# Patient Record
Sex: Male | Born: 1970 | Race: Black or African American | Hispanic: No | Marital: Single | Smoking: Never smoker
Health system: Southern US, Community
[De-identification: ages and names within clinical notes are randomized; demographics above are authoritative.]

## PROBLEM LIST (undated history)

## (undated) DIAGNOSIS — Z9989 Dependence on other enabling machines and devices: Secondary | ICD-10-CM

## (undated) DIAGNOSIS — F419 Anxiety disorder, unspecified: Secondary | ICD-10-CM

## (undated) DIAGNOSIS — G4733 Obstructive sleep apnea (adult) (pediatric): Secondary | ICD-10-CM

## (undated) DIAGNOSIS — F32A Depression, unspecified: Secondary | ICD-10-CM

## (undated) DIAGNOSIS — K37 Unspecified appendicitis: Secondary | ICD-10-CM

## (undated) DIAGNOSIS — R42 Dizziness and giddiness: Secondary | ICD-10-CM

## (undated) HISTORY — DX: Unspecified appendicitis: K37

## (undated) HISTORY — DX: Depression, unspecified: F32.A

## (undated) HISTORY — DX: Anxiety disorder, unspecified: F41.9

## (undated) HISTORY — DX: Dependence on other enabling machines and devices: Z99.89

## (undated) HISTORY — DX: Obstructive sleep apnea (adult) (pediatric): G47.33

---

## 1998-09-26 ENCOUNTER — Emergency Department (HOSPITAL_COMMUNITY): Admission: EM | Admit: 1998-09-26 | Discharge: 1998-09-26 | Payer: Self-pay | Admitting: Emergency Medicine

## 1999-06-02 ENCOUNTER — Emergency Department (HOSPITAL_COMMUNITY): Admission: EM | Admit: 1999-06-02 | Discharge: 1999-06-02 | Payer: Self-pay | Admitting: Emergency Medicine

## 2001-07-18 ENCOUNTER — Emergency Department (HOSPITAL_COMMUNITY): Admission: EM | Admit: 2001-07-18 | Discharge: 2001-07-18 | Payer: Self-pay | Admitting: Emergency Medicine

## 2005-10-18 ENCOUNTER — Emergency Department (HOSPITAL_COMMUNITY): Admission: EM | Admit: 2005-10-18 | Discharge: 2005-10-18 | Payer: Self-pay | Admitting: Emergency Medicine

## 2009-04-27 ENCOUNTER — Emergency Department (HOSPITAL_COMMUNITY): Admission: EM | Admit: 2009-04-27 | Discharge: 2009-04-27 | Payer: Self-pay | Admitting: Emergency Medicine

## 2010-09-15 ENCOUNTER — Ambulatory Visit (HOSPITAL_COMMUNITY)
Admission: RE | Admit: 2010-09-15 | Discharge: 2010-09-15 | Payer: Self-pay | Source: Home / Self Care | Admitting: Family Medicine

## 2011-01-15 ENCOUNTER — Ambulatory Visit (HOSPITAL_COMMUNITY)
Admission: EM | Admit: 2011-01-15 | Discharge: 2011-01-16 | Disposition: A | Discharge status: Home / Self Care | Payer: 59 | Attending: General Surgery | Admitting: General Surgery

## 2011-01-15 ENCOUNTER — Other Ambulatory Visit: Payer: Self-pay | Admitting: General Surgery

## 2011-01-15 ENCOUNTER — Emergency Department (HOSPITAL_COMMUNITY): Payer: 59

## 2011-01-15 DIAGNOSIS — K3580 Unspecified acute appendicitis: Secondary | ICD-10-CM | POA: Insufficient documentation

## 2011-01-15 DIAGNOSIS — G4730 Sleep apnea, unspecified: Secondary | ICD-10-CM | POA: Insufficient documentation

## 2011-01-15 DIAGNOSIS — K660 Peritoneal adhesions (postprocedural) (postinfection): Secondary | ICD-10-CM | POA: Insufficient documentation

## 2011-01-15 DIAGNOSIS — D573 Sickle-cell trait: Secondary | ICD-10-CM | POA: Insufficient documentation

## 2011-01-15 HISTORY — PX: APPENDECTOMY: SHX54

## 2011-01-15 LAB — COMPREHENSIVE METABOLIC PANEL
ALT: 27 U/L (ref 0–53)
Albumin: 4 g/dL (ref 3.5–5.2)
BUN: 14 mg/dL (ref 6–23)
Calcium: 9 mg/dL (ref 8.4–10.5)
Chloride: 107 mEq/L (ref 96–112)
Creatinine, Ser: 1.19 mg/dL (ref 0.4–1.5)
GFR calc non Af Amer: >60 mL/min (ref >60)
Potassium: 4.1 mEq/L (ref 3.5–5.1)
Sodium: 138 mEq/L (ref 135–145)
Total Bilirubin: 0.8 mg/dL (ref 0.3–1.2)
Total Protein: 7.2 g/dL (ref 6.0–8.3)

## 2011-01-15 LAB — URINALYSIS, ROUTINE W REFLEX MICROSCOPIC
Bilirubin Urine: NEGATIVE
Ketones, ur: NEGATIVE mg/dL
Nitrite: NEGATIVE
Specific Gravity, Urine: 1.02 (ref 1.005–1.030)
pH: 5.5 (ref 5.0–8.0)

## 2011-01-15 LAB — DIFFERENTIAL
Basophils Absolute: 0 10*3/uL (ref 0.0–0.1)
Basophils Relative: 0 % (ref 0–1)
Eosinophils Absolute: 0.1 10*3/uL (ref 0.0–0.7)
Eosinophils Relative: 1 % (ref 0–5)
Lymphs Abs: 1.1 10*3/uL (ref 0.7–4.0)
Neutro Abs: 3.7 10*3/uL (ref 1.7–7.7)

## 2011-01-15 LAB — CBC
Hemoglobin: 13.3 g/dL (ref 13.0–17.0)
MCHC: 33.3 g/dL (ref 30.0–36.0)
RBC: 4.61 MIL/uL (ref 4.22–5.81)
WBC: 5.6 10*3/uL (ref 4.0–10.5)

## 2011-01-15 MED ORDER — IOHEXOL 300 MG/ML  SOLN
100.0000 mL | Freq: Once | INTRAMUSCULAR | Status: AC | PRN
  Administered 2011-01-15: 100 mL via INTRAVENOUS

## 2011-01-15 MED ORDER — IOHEXOL 300 MG/ML  SOLN: 100.0000 mL | Freq: Once | INTRAMUSCULAR | Status: AC | PRN

## 2011-01-22 LAB — URINALYSIS, ROUTINE W REFLEX MICROSCOPIC
Glucose, UA: NEGATIVE mg/dL
Ketones, ur: NEGATIVE mg/dL
Protein, ur: NEGATIVE mg/dL
Specific Gravity, Urine: 1.02 (ref 1.005–1.030)
Urobilinogen, UA: 0.2 mg/dL (ref 0.0–1.0)

## 2011-01-22 LAB — BASIC METABOLIC PANEL
Calcium: 9.5 mg/dL (ref 8.4–10.5)
Creatinine, Ser: 1.3 mg/dL (ref 0.4–1.5)
GFR calc Af Amer: >60 mL/min (ref >60)
GFR calc non Af Amer: >60 mL/min (ref >60)
Glucose, Bld: 84 mg/dL (ref 70–99)

## 2011-01-22 LAB — DIFFERENTIAL
Lymphs Abs: 1 10*3/uL (ref 0.7–4.0)
Monocytes Absolute: 0.8 10*3/uL (ref 0.1–1.0)
Monocytes Relative: 9 % (ref 3–12)
Neutro Abs: 6.7 10*3/uL (ref 1.7–7.7)

## 2011-01-22 LAB — CBC
Hemoglobin: 14.8 g/dL (ref 13.0–17.0)
MCHC: 33.3 g/dL (ref 30.0–36.0)
MCV: 90.8 fL (ref 78.0–100.0)
WBC: 8.7 10*3/uL (ref 4.0–10.5)

## 2011-01-22 LAB — LIPASE, BLOOD: Lipase: 24 U/L (ref 11–59)

## 2011-01-23 NOTE — Op Note (Signed)
Isaac Vasquez, Isaac Vasquez              ACCOUNT NO.:  [id number]  MEDICAL RECORD NO.:  [id number]           PATIENT TYPE:  O  LOCATION:  1525                         FACILITY:  [facility]  PHYSICIAN:  Angelia Mould. Derrell Lolling, M.D.DATE OF BIRTH:  1971/08/25  DATE OF PROCEDURE:  01/15/2011 DATE OF DISCHARGE:                              OPERATIVE REPORT   PREOPERATIVE DIAGNOSIS:  Acute appendicitis.  POSTOPERATIVE DIAGNOSIS:  Acute appendicitis.  OPERATION PERFORMED:  Laparoscopic appendectomy.  SURGEON:  Angelia Mould. Derrell Lolling, M.D.  OPERATIVE INDICATIONS:  This is a 40 year old African American male with sickle cell trait and sleep apnea.  He presents with a 24-hour history of right lower quadrant pain.  On exam, he has localized tenderness and involuntary guarding in the right lower quadrant.  CBC is normal, but CT scan shows a dilated appendix with some subtle soft tissue stranding around it.  I felt that clinically he had appendicitis.  He was brought to the operating room emergently.  OPERATIVE FINDINGS:  The appendix was swollen and inflamed, but there was no exudate, there was no gangrene, and there was no evidence of rupture.  There were some adhesions tethering the right colon to the anterior abdominal wall which were taken down carefully under direct vision, and there was no evidence of any injury to the colon after this was done.  The terminal ileum looked normal.  There was no fluid in the pelvis.  The liver looked healthy.  OPERATIVE TECHNIQUE:  Following the induction of general endotracheal anesthesia, Foley catheter was inserted, and the patient's abdomen was prepped and draped in a sterile fashion.  Intravenous antibiotics had been given prior to the procedure.  Surgical time-out was held identifying correct patient and correct procedure.  0.5% Marcaine with epinephrine was used as a local infiltration anesthetic.  An 11-mm Hasson trocar was inserted in the midline just above  the umbilicus with an open technique.  Entry was atraumatic.  This was secured with a purse- string suture of 0 Vicryl.  Pneumoperitoneum was created.  Video cam was inserted.  A 12-mm trocar was placed in the suprapubic area and a 5-mm trocar was placed in the left lower quadrant.  Using the Harmonic scalpel, I carefully took down some very filmy adhesions that were tethering the right colon to the anterior abdominal wall.  The reason for these was unclear, but they were chronic and not acute.  The colon was inspected following taking down these adhesions and there was no evidence of any injury.  I identified the appendix and identified the tip of the appendix and lifted it up with a grasper.  I divided some of the lateral peritoneal attachments to mobilize it medially.  The appendiceal mesentery was divided in small steps using the Harmonic scalpel.  We had good hemostasis.  I did this in several steps until I had completely skeletonized the appendix and could clearly see the junction of the appendix with the wall of the cecum.  An Endo GIA stapler with a vascular 2.5-mm load was inserted and placed transversely and perpendicularly across the base of the appendix.  This was closed down  on the base of the appendix and held in place for about 45 seconds, fired, and removed.  The staple line looked very good.  The appendix was placed in the specimen bag and removed.  I irrigated the operative field.  I inspected all the areas of dissection and everything looked fine.  There was no bleeding from the staple line.  The trocars were removed under direct vision.  There was no bleeding from the trocar sites.  Pneumoperitoneum was released.  The fascia at the umbilicus was closed with 0 Vicryl sutures.  Skin incisions were irrigated and then closed with subcuticular sutures of 4-0 Monocryl and Dermabond.  Clean bandages were placed and the patient was taken to the recovery room in stable  condition.  Estimated blood loss was about 10-20 cc. Complications none.  Sponge, needle, and instrument counts were correct.     Angelia Mould. Derrell Lolling, M.D.     HMI/MEDQ  D:  01/15/2011  T:  01/16/2011  Job:  [id number]  cc:   Renaye Rakers, M.D. Fax: [phone]  Electronically Signed by Claud Kelp M.D. on 01/23/2011 06:48:07 AM

## 2011-01-23 NOTE — H&P (Signed)
NAMEDERRECK, WILTSEY              ACCOUNT NO.:  [id number]  MEDICAL RECORD NO.:  [id number]           PATIENT TYPE:  O  LOCATION:  1525                         FACILITY:  [facility]  PHYSICIAN:  Angelia Mould. Derrell Lolling, M.D.DATE OF BIRTH:  12-22-1970  DATE OF ADMISSION:  01/15/2011 DATE OF DISCHARGE:                             HISTORY & PHYSICAL   CHIEF COMPLAINT:  Right lower quadrant abdominal pain.  HISTORY OF PRESENT ILLNESS:  This is a reasonably healthy 40 year old African American male who noted the onset of right-sided abdominal pain yesterday at 2 o'clock p.m.  It started gradually, but has gotten worse. He vomited once this evening, but only after drinking CT contrast.  He has been having normal bowel movements.  He felt cold at work yesterday and they took his temperature, he said it was 100.0.  No prior similar episodes.  He came to the emergency room.  Lab work has been drawn and is normal. A CT scan shows abnormally enlarged appendix with subtle periappendiceal stranding, consistent with uncomplicated appendicitis.  I was called to evaluate him at that point.  PAST HISTORY:  He has sleep apnea.  He has sickle cell trait.  He has never had any surgery.  He denies any GI tract disease, pulmonary disease, cardiovascular disease, or urologic problems.  CURRENT MEDICATIONS:  Multivitamins.  DRUG ALLERGIES:  None known.  SOCIAL HISTORY:  He is single, has 2 sons, age 60 and 50.  He is here with his girlfriend.  He denies tobacco.  Says he drinks alcohol only once every week or two.  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  FAMILY HISTORY:  Mother living and treated for breast cancer.  Father living and well.  One brother and one sister living and well.  REVIEW OF SYSTEMS:  Ten-system review of systems is performed and is negative except as described above.  PHYSICAL EXAMINATION:  GENERAL:  Pleasant, youngish middle-aged man in mild distress. VITAL  SIGNS:  Temperature 99.0, heart rate 80, blood pressure 126/76, respiratory rate 20 and unlabored. HEENT:  Eyes:  Sclerae clear.  Extraocular movements intact.  Ears, nose, mouth, throat, nose, lips, tongue, and oropharynx are without gross lesions. NECK:  Supple, nontender.  No mass.  No jugular venous distention. Trachea midline. LUNGS:  Clear to auscultation.  No chest wall tenderness. HEART:  Regular rate and rhythm.  No murmur.  Radial and femoral pulses are palpable. ABDOMEN:  Soft.  He is tender with involuntary guarding in the right lower quadrant and to a lesser degree in the suprapubic area.  Upper abdomen and left lower quadrant are nontender.  There is no mass.  There are no scars.  There is no hernia. EXTREMITIES:  He moves all 4 extremities well without pain or deformity. NEUROLOGIC:  No gross motor or sensory deficits.  ADMISSION DATA:  CT scan described above suggesting acute appendicitis. CBC normal.  Urinalysis normal.  ASSESSMENT:  Acute appendicitis, no evidence of perforation or peritonitis.  PLAN: 1. The patient has been started on intravenous Invanz. 2. He will be taken to the operating room for diagnostic laparoscopy  and appendectomy.  I have discussed the indication and details of surgery with him.  Risks and complications have been outlined, including but not limited to bleeding, infection, conversion to open laparotomy, more extensive surgery if alternative diagnosis is found, injury to adjacent organs with major reconstructive surgery, wound healing problems such as infection or hernia, cardiac, pulmonary, and thromboembolic problems. He seems to understand these issues well.  At this time, all of his questions were answered.  He is in full agreement with this plan.     Angelia Mould. Derrell Lolling, M.D.     HMI/MEDQ  D:  01/15/2011  T:  01/16/2011  Job:  [id number]  cc:   Renaye Rakers, M.D. Fax: [phone]  Electronically Signed by Claud Kelp M.D.  on 01/23/2011 06:47:31 AM

## 2011-01-30 ENCOUNTER — Other Ambulatory Visit: Payer: Self-pay | Admitting: Surgery

## 2011-01-30 ENCOUNTER — Other Ambulatory Visit (HOSPITAL_COMMUNITY): Payer: Self-pay | Admitting: Surgery

## 2011-01-30 ENCOUNTER — Other Ambulatory Visit: Payer: Self-pay | Admitting: General Surgery

## 2011-01-30 ENCOUNTER — Ambulatory Visit
Admission: RE | Admit: 2011-01-30 | Discharge: 2011-01-30 | Disposition: A | Discharge status: Home / Self Care | Payer: 59 | Source: Ambulatory Visit | Attending: Surgery | Admitting: Surgery

## 2011-01-30 DIAGNOSIS — K37 Unspecified appendicitis: Secondary | ICD-10-CM

## 2011-01-30 DIAGNOSIS — R198 Other specified symptoms and signs involving the digestive system and abdomen: Secondary | ICD-10-CM

## 2011-01-30 DIAGNOSIS — L0291 Cutaneous abscess, unspecified: Secondary | ICD-10-CM

## 2011-01-30 MED ORDER — IOHEXOL 300 MG/ML  SOLN
125.0000 mL | Freq: Once | INTRAMUSCULAR | Status: AC | PRN
  Administered 2011-01-30: 125 mL via INTRAVENOUS

## 2011-01-31 ENCOUNTER — Other Ambulatory Visit (HOSPITAL_COMMUNITY): Payer: 59

## 2011-03-20 ENCOUNTER — Encounter (INDEPENDENT_AMBULATORY_CARE_PROVIDER_SITE_OTHER): Payer: Self-pay | Admitting: General Surgery

## 2013-01-16 ENCOUNTER — Encounter: Payer: Self-pay | Admitting: Physician Assistant

## 2013-04-07 ENCOUNTER — Ambulatory Visit (INDEPENDENT_AMBULATORY_CARE_PROVIDER_SITE_OTHER): Payer: BLUE CROSS/BLUE SHIELD | Admitting: General Surgery

## 2013-04-07 ENCOUNTER — Other Ambulatory Visit (INDEPENDENT_AMBULATORY_CARE_PROVIDER_SITE_OTHER): Payer: Self-pay | Admitting: General Surgery

## 2013-04-07 ENCOUNTER — Encounter (INDEPENDENT_AMBULATORY_CARE_PROVIDER_SITE_OTHER): Payer: Self-pay | Admitting: General Surgery

## 2013-04-07 VITALS — BP 132/90 | HR 78 | Temp 98.6°F | Resp 16 | Ht 74.0 in | Wt 231.8 lb

## 2013-04-07 DIAGNOSIS — L910 Hypertrophic scar: Secondary | ICD-10-CM

## 2013-04-07 NOTE — Patient Instructions (Signed)
You have a keloid scar, or hypertrophic scar of the umbilical skin. It is not unusual to have pain in this area.  You will be referred to a plastic surgeon for scar revision options and procedures.

## 2013-04-07 NOTE — Progress Notes (Signed)
Patient ID: Isaac Vasquez, male   DOB: [birth year], 42 y.o.   MRN: [id number] History: This patient returns to see me to evaluate a painful hypertrophic scar. I performed laparoscopic appendectomy on 01/15/2011 for histologically confirmed acute separative appendicitis. He recovered uneventfully. He remains healthy. He states he has developed a progressive hypertrophy of the umbilical scar which is somewhat painful when he twists or poles in certain directions.  Exam: Patient looks well. Wife is with him. Abdomen soft and nontender. There is a hypertrophic keloid scar at the super umbilical position, almost 1.5 cm in diameter. The other 2 trocar sites on the left side have healed without any unusual scar formation.  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 Derrell Lolling, M.D., [facility] General and Minimally invasive Surgery Breast and Colorectal Surgery Office:   [phone] Pager:   [phone]

## 2013-04-08 ENCOUNTER — Telehealth (INDEPENDENT_AMBULATORY_CARE_PROVIDER_SITE_OTHER): Payer: Self-pay | Admitting: General Surgery

## 2013-04-08 NOTE — Telephone Encounter (Signed)
Left message on machine for patient to call back and ask for me. To make patient aware I have made a plastic surgery referral to Dr Etter Sjogren. Appt 04/27/2013 at 2:30pm. Dr Shon Hough was out of network with his insurance. Records faxed to Dr Odis Luster at [phone]. Confirmation received. Awaiting call back from patient to make him aware of appt.

## 2013-04-08 NOTE — Telephone Encounter (Signed)
Patient called back- spoke with [facility] and was made aware of appts.

## 2013-04-27 ENCOUNTER — Ambulatory Visit
Admission: RE | Admit: 2013-04-27 | Discharge: 2013-04-27 | Disposition: A | Discharge status: Home / Self Care | Payer: BC Managed Care – PPO | Source: Ambulatory Visit | Attending: Family Medicine | Admitting: Family Medicine

## 2013-04-27 ENCOUNTER — Other Ambulatory Visit: Payer: Self-pay | Admitting: Family Medicine

## 2013-04-27 DIAGNOSIS — M1250 Traumatic arthropathy, unspecified site: Secondary | ICD-10-CM

## 2013-07-29 ENCOUNTER — Ambulatory Visit: Payer: BC Managed Care – PPO

## 2013-07-29 ENCOUNTER — Ambulatory Visit (INDEPENDENT_AMBULATORY_CARE_PROVIDER_SITE_OTHER): Payer: BC Managed Care – PPO | Admitting: Family Medicine

## 2013-07-29 VITALS — BP 132/78 | HR 75 | Temp 98.2°F | Resp 18 | Ht 74.0 in | Wt 233.6 lb

## 2013-07-29 DIAGNOSIS — S61219A Laceration without foreign body of unspecified finger without damage to nail, initial encounter: Secondary | ICD-10-CM

## 2013-07-29 DIAGNOSIS — M79645 Pain in left finger(s): Secondary | ICD-10-CM

## 2013-07-29 DIAGNOSIS — M79609 Pain in unspecified limb: Secondary | ICD-10-CM

## 2013-07-29 DIAGNOSIS — S61209A Unspecified open wound of unspecified finger without damage to nail, initial encounter: Secondary | ICD-10-CM

## 2013-07-29 NOTE — Patient Instructions (Signed)

## 2013-07-29 NOTE — Progress Notes (Signed)
Reviewed documentation and xray and agree w/ assessment and plan. Katessa Attridge, MD MPH   

## 2013-07-29 NOTE — Progress Notes (Signed)
  Subjective:    Patient ID: Isaac Vasquez, male    DOB: 12-Jul-1971, 42 y.o.   MRN: [id number]  HPI   Isaac Vasquez is a very pleasant 42 yr old male here after sustaining a laceration to his left index finger.  Laceration occurred about 2 hours ago while he was preparing dinner.  Sliced finger while he was chopping food.  He denies numbness, tingling, weakness but does have pain  He is right hand dominant.  Last tetanus 1 yr ago    Review of Systems  Constitutional: Negative.   HENT: Negative.   Respiratory: Negative.   Cardiovascular: Negative.   Gastrointestinal: Positive for abdominal distention.  Musculoskeletal: Negative.   Skin: Positive for wound.  Neurological: Negative.        Objective:   Physical Exam  Vitals reviewed. Constitutional: He is oriented to person, place, and time. He appears well-developed and well-nourished. No distress.  HENT:  Head: Normocephalic and atraumatic.  Eyes: Conjunctivae are normal. No scleral icterus.  Pulmonary/Chest: Effort normal.  Musculoskeletal:       Hands: Laceration at distal end of left index finger; very tender to palpation; full ROM but with pain  Neurological: He is alert and oriented to person, place, and time.  Skin: Skin is warm and dry. Laceration noted.  Psychiatric: He has a normal mood and affect. His behavior is normal.    [facility] reading (PRIMARY) by  Dr. Clelia Croft - no fracture   Procedure Note: Verbal consent obtained from the patient.  Metacarpal block with 2 cc Lidocaine 2% without epinephrine.  Wound scrubbed with soap and water.  Wound explored.  No foreign bodies or deep structure injury noted.  Wound closed with #3 simple interrupted sutures of 5-0 ethilon.  Area cleansed and dressed.       Assessment & Plan:  Finger laceration, initial encounter - Plan: DG Finger Index Left  Pain in finger of left hand   Isaac Vasquez is a pleasant 42 yr old male here with laceration to left index finger.  Due to significant pain  with palpation, xray was performed and ruled out fracture.  Laceration repaired as above.  Anticipate suture removal in 7 days.  Discussed wound care and RTC precautions.

## 2013-08-15 ENCOUNTER — Ambulatory Visit (INDEPENDENT_AMBULATORY_CARE_PROVIDER_SITE_OTHER): Payer: BC Managed Care – PPO | Admitting: Physician Assistant

## 2013-08-15 VITALS — BP 128/72 | HR 78 | Temp 98.1°F | Resp 18

## 2013-08-15 DIAGNOSIS — S61402D Unspecified open wound of left hand, subsequent encounter: Secondary | ICD-10-CM

## 2013-08-15 DIAGNOSIS — Z5189 Encounter for other specified aftercare: Secondary | ICD-10-CM

## 2013-08-15 NOTE — Progress Notes (Signed)
  Subjective:    Patient ID: Isaac Vasquez, male    DOB: December 05, 1970, 42 y.o.   MRN: [id number]  HPI 42 year old male presents for suture removal. DOI 07/29/13.  Wound is of left index finger. Patient denies erythema, warmth, or drainage. Doing well with no issues or complaints    Review of Systems  Constitutional: Negative for fever and chills.  Gastrointestinal: Negative for nausea and vomiting.  Skin: Positive for wound. Negative for color change.       Objective:   Physical Exam  Constitutional: He is oriented to person, place, and time. He appears well-developed and well-nourished.  HENT:  Head: Normocephalic and atraumatic.  Right Ear: External ear normal.  Left Ear: External ear normal.  Eyes: Conjunctivae are normal.  Neck: Normal range of motion.  Cardiovascular: Normal rate.   Pulmonary/Chest: Effort normal.  Neurological: He is alert and oriented to person, place, and time.  Psychiatric: He has a normal mood and affect. His behavior is normal. Judgment and thought content normal.      Sutures removed without difficulty.     Assessment & Plan:   Wound, open, hand with or without fingers with complication, left, subsequent encounter  Wound well healing.  Follow up as needed.

## 2014-01-02 IMAGING — CR DG FINGER INDEX 2+V*L*
1 series · 1 of 1 positions shown · non-contrast
Comparison: None.

CLINICAL DATA: Pain post trauma

EXAM:
LEFT INDEX FINGER 2+V

[PA]
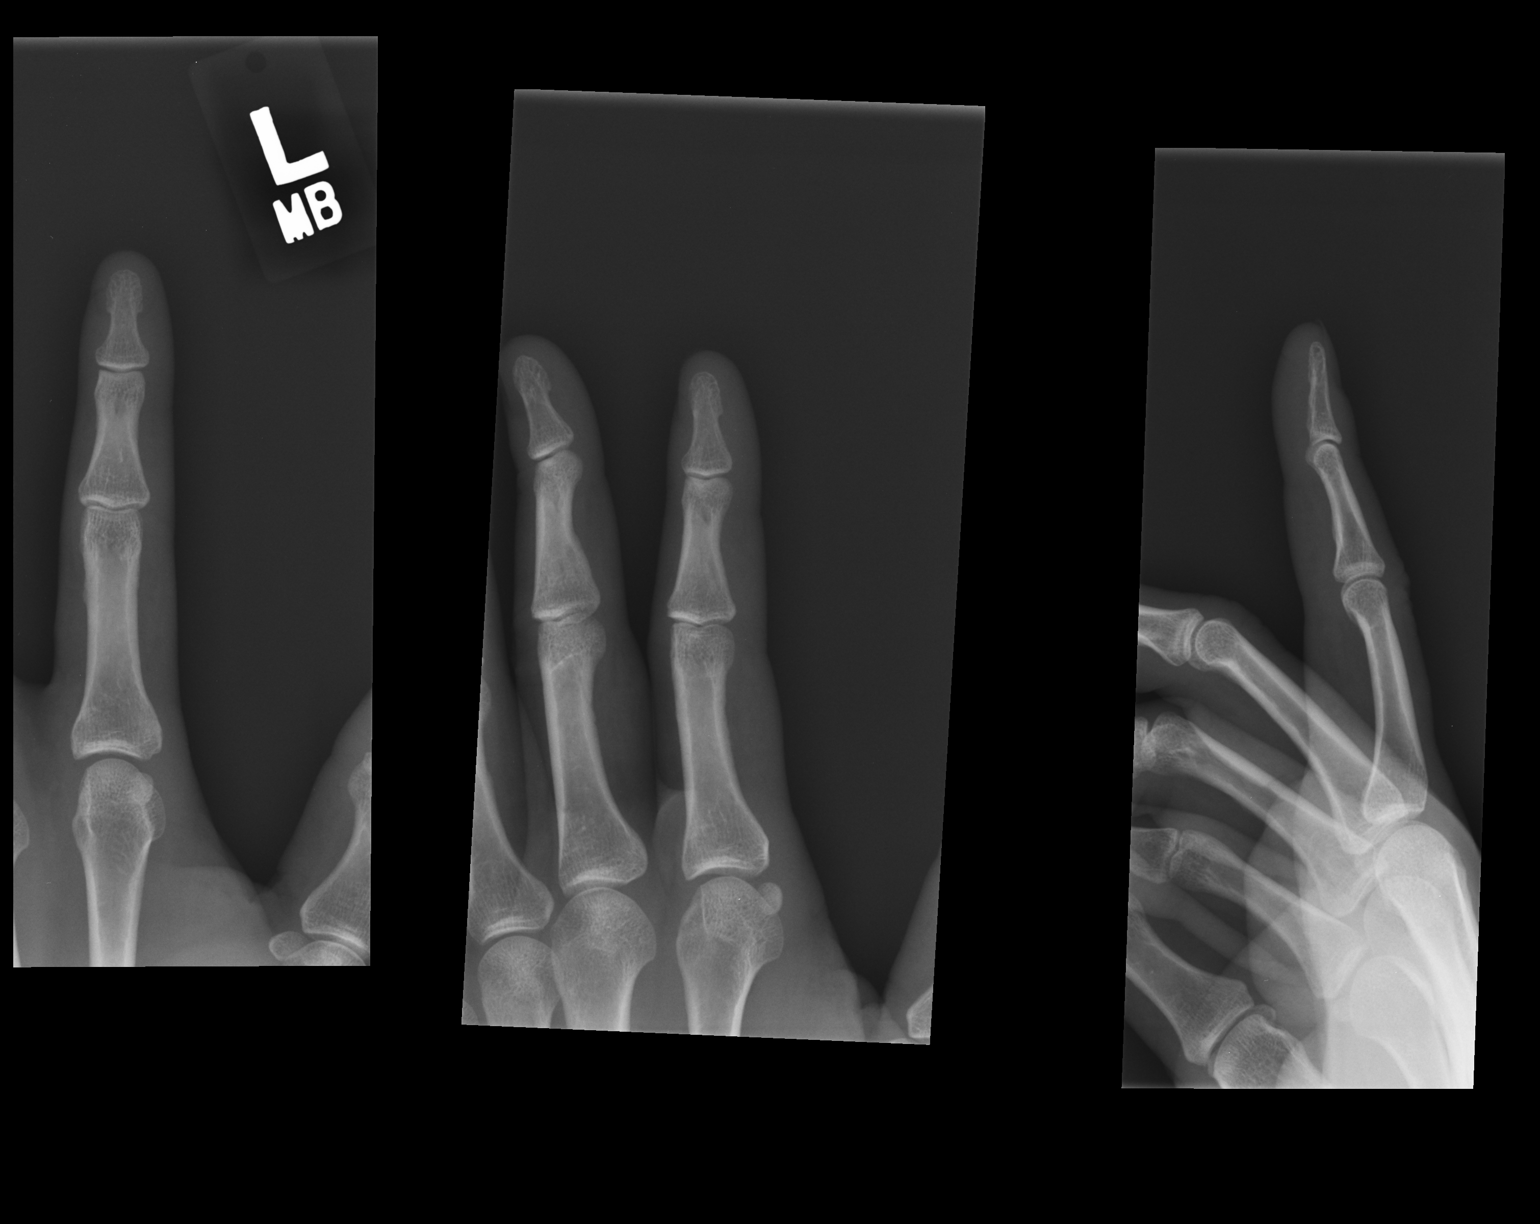

[1 of 1 positions shown; findings below may reference images not displayed]

FINDINGS: Frontal, oblique, and lateral views were obtained. There is no
fracture or dislocation. Joint spaces appear intact. No erosive
change. No radiopaque foreign body.
IMPRESSION: No abnormality noted.

## 2015-03-09 ENCOUNTER — Encounter (HOSPITAL_COMMUNITY): Payer: Self-pay | Admitting: Emergency Medicine

## 2015-03-09 ENCOUNTER — Emergency Department (HOSPITAL_COMMUNITY)
Admission: EM | Admit: 2015-03-09 | Discharge: 2015-03-09 | Disposition: A | Discharge status: Home / Self Care | Payer: 59 | Attending: Emergency Medicine | Admitting: Emergency Medicine

## 2015-03-09 DIAGNOSIS — Z79899 Other long term (current) drug therapy: Secondary | ICD-10-CM | POA: Insufficient documentation

## 2015-03-09 DIAGNOSIS — R55 Syncope and collapse: Secondary | ICD-10-CM | POA: Diagnosis present

## 2015-03-09 DIAGNOSIS — Z8719 Personal history of other diseases of the digestive system: Secondary | ICD-10-CM | POA: Diagnosis not present

## 2015-03-09 DIAGNOSIS — R42 Dizziness and giddiness: Secondary | ICD-10-CM | POA: Insufficient documentation

## 2015-03-09 HISTORY — DX: Dizziness and giddiness: R42

## 2015-03-09 LAB — BASIC METABOLIC PANEL
ANION GAP: 10 (ref 5–15)
BUN: 20 mg/dL (ref 6–20)
CO2: 26 mmol/L (ref 22–32)
CREATININE: 0.99 mg/dL (ref 0.61–1.24)
Calcium: 9.5 mg/dL (ref 8.9–10.3)
Chloride: 102 mmol/L (ref 101–111)
GFR calc non Af Amer: >60 mL/min (ref >60)
GLUCOSE: 108 mg/dL — AB (ref 65–99)
POTASSIUM: 3.9 mmol/L (ref 3.5–5.1)
SODIUM: 138 mmol/L (ref 135–145)

## 2015-03-09 LAB — CBC
HCT: 42 % (ref 39.0–52.0)
HEMOGLOBIN: 14.1 g/dL (ref 13.0–17.0)
MCH: 29.6 pg (ref 26.0–34.0)
MCHC: 33.6 g/dL (ref 30.0–36.0)
MCV: 88.2 fL (ref 78.0–100.0)
Platelets: 184 10*3/uL (ref 150–400)
RBC: 4.76 MIL/uL (ref 4.22–5.81)
RDW: 12.8 % (ref 11.5–15.5)
WBC: 5.7 10*3/uL (ref 4.0–10.5)

## 2015-03-09 MED ORDER — MECLIZINE HCL 25 MG PO TABS
25.0000 mg | ORAL_TABLET | Freq: Once | ORAL | Status: AC
  Administered 2015-03-09: 25 mg via ORAL
  Filled 2015-03-09: qty 1

## 2015-03-09 MED ORDER — MECLIZINE HCL 50 MG PO TABS: 50.0000 mg | ORAL_TABLET | Freq: Two times a day (BID) | ORAL | Status: DC

## 2015-03-09 MED ORDER — ACETAMINOPHEN 500 MG PO TABS
1000.0000 mg | ORAL_TABLET | Freq: Once | ORAL | Status: AC
  Administered 2015-03-09: 1000 mg via ORAL
  Filled 2015-03-09: qty 2

## 2015-03-09 NOTE — ED Notes (Signed)
Pt states he is dizzy and has pain to the back of his head and neck  Pt states EMS came out to his job but he refused transport  Pt states ems told him he had positive orthostatic vital signs  Pt states he continues to have dizziness and does not feel right

## 2015-03-09 NOTE — ED Provider Notes (Signed)
CSN: [id number]     Arrival date & time 03/09/15  1940 History   First MD Initiated Contact with Patient 03/09/15 2051     Chief Complaint  Patient presents with  . Near Syncope     (Consider location/radiation/quality/duration/timing/severity/associated sxs/prior Treatment) HPI Comments: Patient presents emergency department with chief complaint of dizziness. Patient states that the dizziness began around 3:00 today. He describes the room as spinning. He also complains of a headache.  The dizziness is aggravated with head movement.  Nothing makes it better.  He has a hx of the same.  Denies any vision changes, numbness, weakness, or tingling of the extremities.    The history is provided by the patient. No language interpreter was used.    Past Medical History  Diagnosis Date  . Appendicitis   . Vertigo    Past Surgical History  Procedure Laterality Date  . Appendectomy  01/15/11   Family History  Problem Relation Age of Onset  . Cancer Mother   . Cancer Father    History  Substance Use Topics  . Smoking status: Never Smoker   . Smokeless tobacco: Not on file  . Alcohol Use: Yes     Comment: OCCASSIONAL    Review of Systems  Constitutional: Negative for fever and chills.  Respiratory: Negative for shortness of breath.   Cardiovascular: Negative for chest pain.  Gastrointestinal: Negative for nausea, vomiting, diarrhea and constipation.  Genitourinary: Negative for dysuria.  Neurological: Positive for dizziness.  All other systems reviewed and are negative.     Allergies  Review of patient's allergies indicates no known allergies.  Home Medications   Prior to Admission medications   Medication Sig Start Date End Date Taking? Authorizing Provider  cetirizine (ZYRTEC) 10 MG tablet Take 10 mg by mouth daily.   Yes Historical Provider, MD  Multiple Vitamin (MULTIVITAMIN WITH MINERALS) TABS tablet Take 1 tablet by mouth daily.   Yes Historical Provider, MD   BP  149/74 mmHg  Pulse 67  Temp(Src) 97.8 F (36.6 C) (Oral)  Resp 10  SpO2 99% Physical Exam  Constitutional: He is oriented to person, place, and time. He appears well-developed and well-nourished.  HENT:  Head: Normocephalic and atraumatic.  Horizontal nystagmus   Eyes: Conjunctivae and EOM are normal. Pupils are equal, round, and reactive to light. Right eye exhibits no discharge. Left eye exhibits no discharge. No scleral icterus.  Neck: Normal range of motion. Neck supple. No JVD present.  Cardiovascular: Normal rate, regular rhythm and normal heart sounds.  Exam reveals no gallop and no friction rub.   No murmur heard. Pulmonary/Chest: Effort normal and breath sounds normal. No respiratory distress. He has no wheezes. He has no rales. He exhibits no tenderness.  Abdominal: Soft. He exhibits no distension and no mass. There is no tenderness. There is no rebound and no guarding.  Musculoskeletal: Normal range of motion. He exhibits no edema or tenderness.  Neurological: He is alert and oriented to person, place, and time.  CN 3-12 intact, normal finger to nose, normal heel to shin, speech is clear, movements are goal oriented  Skin: Skin is warm and dry.  Psychiatric: He has a normal mood and affect. His behavior is normal. Judgment and thought content normal.  Nursing note and vitals reviewed.   ED Course  Procedures (including critical care time) Labs Review Labs Reviewed  BASIC METABOLIC PANEL - Abnormal; Notable for the following:    Glucose, Bld 108 (*)    All  other components within normal limits  CBC    Imaging Review No results found.   EKG Interpretation None      MDM   Final diagnoses:  Vertigo    Patient with dizziness that started earlier today. He has a history of vertigo. Describes his symptoms as the room is spinning. Symptoms are made worse with head movement. Neurovascularly intact. I suspect this is vertigo.  9:57 PM Patient seen by and discussed  with Dr. Eulis Foster, who recommends treating for peripheral vertigo. Patient given meclizine. Will attempt to ambulate. Will reassess.  Patient feels improved, ambulates without difficulty will discharge with meclizine.  Montine Circle, PA-C 03/10/15 0102  Daleen Bo, MD 03/11/15 [phone]

## 2015-03-09 NOTE — ED Provider Notes (Signed)
  Face-to-face evaluation   History: He reports onset of dizziness described as spinning, today at about 3:00. He was evaluated by EMS and refused transport. He had 2 episodes of vomiting  Physical exam: Alert, calm, cooperative. He is eating a cracker. He has bilateral direction changing nystagmus, which is extinguishable. Positive symptoms with head impulse testing. Negative test of skew.  Medical screening examination/treatment/procedure(s) were conducted as a shared visit with non-physician practitioner(s) and myself.  I personally evaluated the patient during the encounter  Daleen Bo, MD 03/11/15 [phone]

## 2015-03-09 NOTE — Discharge Instructions (Signed)
Benign Positional Vertigo Vertigo means you feel like you or your surroundings are moving when they are not. Benign positional vertigo is the most common form of vertigo. Benign means that the cause of your condition is not serious. Benign positional vertigo is more common in older adults. CAUSES  Benign positional vertigo is the result of an upset in the labyrinth system. This is an area in the middle ear that helps control your balance. This may be caused by a viral infection, head injury, or repetitive motion. However, often no specific cause is found. SYMPTOMS  Symptoms of benign positional vertigo occur when you move your head or eyes in different directions. Some of the symptoms may include:  Loss of balance and falls.  Vomiting.  Blurred vision.  Dizziness.  Nausea.  Involuntary eye movements (nystagmus). DIAGNOSIS  Benign positional vertigo is usually diagnosed by physical exam. If the specific cause of your benign positional vertigo is unknown, your caregiver may perform imaging tests, such as magnetic resonance imaging (MRI) or computed tomography (CT). TREATMENT  Your caregiver may recommend movements or procedures to correct the benign positional vertigo. Medicines such as meclizine, benzodiazepines, and medicines for nausea may be used to treat your symptoms. In rare cases, if your symptoms are caused by certain conditions that affect the inner ear, you may need surgery. HOME CARE INSTRUCTIONS   Follow your caregiver's instructions.  Move slowly. Do not make sudden body or head movements.  Avoid driving.  Avoid operating heavy machinery.  Avoid performing any tasks that would be dangerous to you or others during a vertigo episode.  Drink enough fluids to keep your urine clear or pale yellow. SEEK IMMEDIATE MEDICAL CARE IF:   You develop problems with walking, weakness, numbness, or using your arms, hands, or legs.  You have difficulty speaking.  You develop  severe headaches.  Your nausea or vomiting continues or gets worse.  You develop visual changes.  Your family or friends notice any behavioral changes.  Your condition gets worse.  You have a fever.  You develop a stiff neck or sensitivity to light. MAKE SURE YOU:   Understand these instructions.  Will watch your condition.  Will get help right away if you are not doing well or get worse. Document Released: 07/09/2006 Document Revised: 12/24/2011 Document Reviewed: 06/21/2011 [facility] Patient Information 2015 [place], [place]. This information is not intended to replace advice given to you by your health care provider. Make sure you discuss any questions you have with your health care provider.  Dizziness Dizziness is a common problem. It is a feeling of unsteadiness or light-headedness. You may feel like you are about to faint. Dizziness can lead to injury if you stumble or fall. A person of any age group can suffer from dizziness, but dizziness is more common in older adults. CAUSES  Dizziness can be caused by many different things, including:  Middle ear problems.  Standing for too long.  Infections.  An allergic reaction.  Aging.  An emotional response to something, such as the sight of blood.  Side effects of medicines.  Tiredness.  Problems with circulation or blood pressure.  Excessive use of alcohol or medicines, or illegal drug use.  Breathing too fast (hyperventilation).  An irregular heart rhythm (arrhythmia).  A low red blood cell count (anemia).  Pregnancy.  Vomiting, diarrhea, fever, or other illnesses that cause body fluid loss (dehydration).  Diseases or conditions such as Parkinson's disease, high blood pressure (hypertension), diabetes, and thyroid problems.  Exposure to extreme heat. DIAGNOSIS  Your health care provider will ask about your symptoms, perform a physical exam, and perform an electrocardiogram (ECG) to record the electrical  activity of your heart. Your health care provider may also perform other heart or blood tests to determine the cause of your dizziness. These may include:  Transthoracic echocardiogram (TTE). During echocardiography, sound waves are used to evaluate how blood flows through your heart.  Transesophageal echocardiogram (TEE).  Cardiac monitoring. This allows your health care provider to monitor your heart rate and rhythm in real time.  Holter monitor. This is a portable device that records your heartbeat and can help diagnose heart arrhythmias. It allows your health care provider to track your heart activity for several days if needed.  Stress tests by exercise or by giving medicine that makes the heart beat faster. TREATMENT  Treatment of dizziness depends on the cause of your symptoms and can vary greatly. HOME CARE INSTRUCTIONS   Drink enough fluids to keep your urine clear or pale yellow. This is especially important in very hot weather. In older adults, it is also important in cold weather.  Take your medicine exactly as directed if your dizziness is caused by medicines. When taking blood pressure medicines, it is especially important to get up slowly.  Rise slowly from chairs and steady yourself until you feel okay.  In the morning, first sit up on the side of the bed. When you feel okay, stand slowly while holding onto something until you know your balance is fine.  Move your legs often if you need to stand in one place for a long time. Tighten and relax your muscles in your legs while standing.  Have someone stay with you for 1-2 days if dizziness continues to be a problem. Do this until you feel you are well enough to stay alone. Have the person call your health care provider if he or she notices changes in you that are concerning.  Do not drive or use heavy machinery if you feel dizzy.  Do not drink alcohol. SEEK IMMEDIATE MEDICAL CARE IF:   Your dizziness or light-headedness  gets worse.  You feel nauseous or vomit.  You have problems talking, walking, or using your arms, hands, or legs.  You feel weak.  You are not thinking clearly or you have trouble forming sentences. It may take a friend or family member to notice this.  You have chest pain, abdominal pain, shortness of breath, or sweating.  Your vision changes.  You notice any bleeding.  You have side effects from medicine that seems to be getting worse rather than better. MAKE SURE YOU:   Understand these instructions.  Will watch your condition.  Will get help right away if you are not doing well or get worse. Document Released: 03/27/2001 Document Revised: 10/06/2013 Document Reviewed: 04/20/2011 [facility] Patient Information 2015 [place], [place]. This information is not intended to replace advice given to you by your health care provider. Make sure you discuss any questions you have with your health care provider.

## 2016-04-05 ENCOUNTER — Ambulatory Visit: Payer: 59 | Admitting: Family Medicine

## 2016-04-26 ENCOUNTER — Encounter: Payer: Self-pay | Admitting: Family Medicine

## 2016-04-26 ENCOUNTER — Ambulatory Visit (INDEPENDENT_AMBULATORY_CARE_PROVIDER_SITE_OTHER): Payer: 59 | Admitting: Family Medicine

## 2016-04-26 ENCOUNTER — Ambulatory Visit (INDEPENDENT_AMBULATORY_CARE_PROVIDER_SITE_OTHER)
Admission: RE | Admit: 2016-04-26 | Discharge: 2016-04-26 | Disposition: A | Discharge status: Home / Self Care | Payer: 59 | Source: Ambulatory Visit | Attending: Family Medicine | Admitting: Family Medicine

## 2016-04-26 VITALS — BP 140/90 | HR 69 | Temp 98.5°F | Resp 12 | Ht 74.0 in | Wt 237.4 lb

## 2016-04-26 DIAGNOSIS — Z6830 Body mass index (BMI) 30.0-30.9, adult: Secondary | ICD-10-CM | POA: Diagnosis not present

## 2016-04-26 DIAGNOSIS — R030 Elevated blood-pressure reading, without diagnosis of hypertension: Secondary | ICD-10-CM

## 2016-04-26 DIAGNOSIS — M545 Low back pain: Secondary | ICD-10-CM

## 2016-04-26 DIAGNOSIS — G8929 Other chronic pain: Secondary | ICD-10-CM | POA: Diagnosis not present

## 2016-04-26 LAB — BASIC METABOLIC PANEL
BUN: 19 mg/dL (ref 6–23)
CHLORIDE: 105 meq/L (ref 96–112)
CO2: 28 meq/L (ref 19–32)
Calcium: 9.3 mg/dL (ref 8.4–10.5)
Creatinine, Ser: 1.25 mg/dL (ref 0.40–1.50)
GFR: 80.04 mL/min (ref >60.00)
GLUCOSE: 93 mg/dL (ref 70–99)
Potassium: 4.2 mEq/L (ref 3.5–5.1)
Sodium: 139 mEq/L (ref 135–145)

## 2016-04-26 LAB — POCT URINALYSIS DIPSTICK
Bilirubin, UA: NEGATIVE
Blood, UA: NEGATIVE
GLUCOSE UA: NEGATIVE
KETONES UA: NEGATIVE
Leukocytes, UA: NEGATIVE
Nitrite, UA: NEGATIVE
Protein, UA: NEGATIVE
Urobilinogen, UA: 0.2
pH, UA: 6

## 2016-04-26 NOTE — Progress Notes (Signed)
HPI:   [name] is a 45 y.o. male, who is here today to establish care with me.  Former PCP: Dr Isaac Vasquez. Last preventive routine visit: a months ago.  Concerns today: right low back pain. Hx of back "spasms" He has had back spasms since 2010 after appendicectomy, stopped for 4-5 years and started back about 6 months ago.  Intermittent, no radiated, no hx of trauma. Mentions that his shoe size has increased, attributed to pronounce arch. This happens daily depending of level of activity, last "a few moments" 8/10.  He has not tried medication for this.   His BP elevated today, no prior Hx of HTN.  Slept about 4 hours last night, stayed up late.   He does not exercise regularly due to work schedule but he eats healthy otherwise. Hx of migraines, he knows his trigger factors and if he avoids those he has not headache.  No gross hematuria or Hx of smoking.  Lab Results  Component Value Date   CREATININE 0.99 03/09/2015   BUN 20 03/09/2015   NA 138 03/09/2015   K 3.9 03/09/2015   CL 102 03/09/2015   CO2 26 03/09/2015   + Stress.   Review of Systems  Constitutional: Negative for fever, activity change, appetite change, fatigue and unexpected weight change.  HENT: Negative for nosebleeds, sore throat and trouble swallowing.   Eyes: Negative for redness and visual disturbance.  Respiratory: Negative for apnea, cough, shortness of breath and wheezing.   Cardiovascular: Negative for chest pain, palpitations and leg swelling.  Gastrointestinal: Negative for nausea, vomiting and abdominal pain.  Endocrine: Negative for polydipsia, polyphagia and polyuria.  Genitourinary: Negative for dysuria, hematuria and decreased urine volume.  Musculoskeletal: Positive for back pain. Negative for gait problem.  Skin: Negative for color change and rash.  Neurological: Negative for dizziness, seizures, weakness, numbness and headaches.  Psychiatric/Behavioral: Negative  for confusion and sleep disturbance.      Current Outpatient Prescriptions on File Prior to Visit  Medication Sig Dispense Refill  . cetirizine (ZYRTEC) 10 MG tablet Take 10 mg by mouth daily.    . Multiple Vitamin (MULTIVITAMIN WITH MINERALS) TABS tablet Take 1 tablet by mouth daily.     No current facility-administered medications on file prior to visit.     Past Medical History  Diagnosis Date  . Appendicitis   . Vertigo    No Known Allergies  Family History  Problem Relation Age of Onset  . Breast cancer Mother   . Prostate cancer Father     Social History   Social History  . Marital Status: Single    Spouse Name: N/A  . Number of Children: N/A  . Years of Education: N/A   Social History Main Topics  . Smoking status: Never Smoker   . Smokeless tobacco: None  . Alcohol Use: Yes     Comment: OCCASSIONAL  . Drug Use: No  . Sexual Activity: Not Asked   Other Topics Concern  . None   Social History Narrative    Filed Vitals:   04/26/16 0804  BP: 140/90  Pulse: 69  Temp: 98.5 F (36.9 C)  Resp: 12    Body mass index is 30.46 kg/(m^2).  SpO2 Readings from Last 3 Encounters:  04/26/16 97%  03/09/15 99%  08/15/13 100%       Physical Exam  Nursing note and vitals reviewed. Constitutional: He is oriented to person, place, and time. He appears well-developed.  No distress.  HENT:  Head: Atraumatic.  Mouth/Throat: Oropharynx is clear and moist and mucous membranes are normal.  Eyes: Conjunctivae and EOM are normal. Pupils are equal, round, and reactive to light.  Cardiovascular: Normal rate and regular rhythm.   No murmur heard. Pulses:      Dorsalis pedis pulses are 2+ on the right side, and 2+ on the left side.  Respiratory: Effort normal and breath sounds normal. No respiratory distress.  GI: Soft. He exhibits no mass. There is no hepatomegaly. There is no tenderness.  Musculoskeletal: He exhibits no edema.  + tenderness upon palpation of  paraspinal muscles, L3-4 right side. Pain is not elicited with movement on exam table during examination.     Lymphadenopathy:    He has no cervical adenopathy.  Neurological: He is alert and oriented to person, place, and time. He has normal strength. No cranial nerve deficit. Coordination and gait normal.  Reflex Scores:      Patellar reflexes are 2+ on the right side and 2+ on the left side. SLR negative bilateral.  Skin: Skin is warm. No erythema.  Psychiatric: His mood appears anxious (mild).  Well groomed, good eye contact.      ASSESSMENT AND PLAN:   Lab Results  Component Value Date   CREATININE 1.25 04/26/2016   BUN 19 04/26/2016   NA 139 04/26/2016   K 4.2 04/26/2016   CL 105 04/26/2016   CO2 28 04/26/2016     [name] was seen today for new patient (initial visit).  Diagnoses and all orders for this visit:  Chronic right-sided low back pain without sciatica  Seems to be musculoskeletal. He is not interested in medication for now. Because chronic and reporting no prior imaging done, lumbar plain x-ray was arranged. I think he would benefit from either PT or chiropractor evaluation, he decides to do the latter one. He doesn't need a referral so he will schedule appointment. Instructed about warning signs. Follow-up as needed.  -     DG Lumbar Spine Complete; Future -     POCT urinalysis dipstick: Negative   BMI 30.0-30.9,adult  We discussed benefits of wt loss as well as adverse effects of obesity. Consistency with healthy diet and physical activity recommended. Weight Watchers is a good option as well as daily brisk walking for 15-30 min as tolerated.  -     Basic metabolic panel   Elevated blood pressure (not hypertension)  Recheck and better reading, initial BP 150/98. Recommend checking blood pressure clinically. Healthy diet and regular physical activity for primary prevention. Goal < 140/90    We will try to obtain medical records from  former PCP, including labs he had a month ago. Planning on seeing him back in a year for his routine physical, before if needed.       Betty G. Martinique, MD  [facility] [place] office.

## 2016-04-26 NOTE — Patient Instructions (Addendum)
A few things to remember from today's visit:   Chronic right-sided low back pain without sciatica  BMI 30.0-30.9,adult Urine negative.    New recommendation for screening colonoscopy are to start at 53 in [place], you could find out from your insuracne if they will cover one.  We will try to obtain copy of labs done a month ago and recommend more labs if needed.  Today kidney function and urine done.   Healthy diet and regular exercise, brisk daily walking 15-30 min is a good option. Chiropractor recommended.   Please be sure medication list is accurate. If a new problem present, please set up appointment sooner than planned today.

## 2016-04-26 NOTE — Progress Notes (Signed)
Pre visit review using our clinic review tool, if applicable. No additional management support is needed unless otherwise documented below in the visit note. 

## 2016-05-22 ENCOUNTER — Other Ambulatory Visit: Payer: Self-pay | Admitting: General Surgery

## 2017-04-21 NOTE — Progress Notes (Signed)
HPI:  [name] is a 46 y.o.male here today for his routine physical examination. Last visit on 04/26/2016. Since his last OV he had surgery, mass removed from trunk in 05/22/2016, stayed in hospital for about 2 days.  He lives with girlfriend.  Regular exercise 3 or more times per week: Yes, he is swims, resistance exercise, and wt lifting. Following a healthy diet: Not consistently.   Chronic medical problems: Lower back pain (muscle spasms), elevated BP readings, seasonal allergies,and obesity among some.OSA with CPAP.  Hx of STD's: Many years ago trich. No sexually active for over year.His girlfriend has multiple medical problem.   -Denies high alcohol intake, tobacco use, or Hx of illicit drug use.  -Concerns today:   He has not noted morning erections and has noted that erections are not "a they use to be." He has erections but not as strong and lasting less than usual. This has been going on for 4 months. He denies decreased libido,depression symptoms, or fatigue.  Denies dysuria,increased urinary frequency, gross hematuria,or decreased urine output.   Review of Systems  Constitutional: Negative for activity change, appetite change, fatigue, fever and unexpected weight change.  HENT: Negative for dental problem, hearing loss, mouth sores, nosebleeds, sore throat, trouble swallowing and voice change.   Eyes: Negative for redness and visual disturbance.  Respiratory: Negative for cough, shortness of breath and wheezing.   Cardiovascular: Negative for chest pain, palpitations and leg swelling.  Gastrointestinal: Negative for abdominal pain, blood in stool, nausea and vomiting.  Endocrine: Negative for cold intolerance, heat intolerance, polydipsia, polyphagia and polyuria.  Genitourinary: Negative for decreased urine volume, dysuria, genital sores, hematuria and testicular pain.  Musculoskeletal: Positive for back pain (occasional). Negative for joint  swelling and myalgias.  Skin: Negative for rash.  Allergic/Immunologic: Positive for environmental allergies.  Neurological: Negative for dizziness, syncope, weakness, numbness and headaches.  Hematological: Negative for adenopathy. Does not bruise/bleed easily.  Psychiatric/Behavioral: Negative for confusion and sleep disturbance. The patient is not nervous/anxious.   All other systems reviewed and are negative.    Current Outpatient Prescriptions on File Prior to Visit  Medication Sig Dispense Refill  . cetirizine (ZYRTEC) 10 MG tablet Take 10 mg by mouth daily.    . Multiple Vitamin (MULTIVITAMIN WITH MINERALS) TABS tablet Take 1 tablet by mouth daily.     No current facility-administered medications on file prior to visit.      Past Medical History:  Diagnosis Date  . Appendicitis   . OSA on CPAP   . Vertigo    Past Surgical History:  Procedure Laterality Date  . APPENDECTOMY  01/15/11    No Known Allergies  Family History  Problem Relation Age of Onset  . Breast cancer Mother   . Cancer Mother        breast  . Prostate cancer Father   . Cancer Father 27       prostate  . Diabetes Neg Hx     Social History   Social History  . Marital status: Single    Spouse name: N/A  . Number of children: N/A  . Years of education: N/A   Social History Main Topics  . Smoking status: Never Smoker  . Smokeless tobacco: Never Used  . Alcohol use Yes     Comment: OCCASSIONAL  . Drug use: No  . Sexual activity: Not Currently   Other Topics Concern  . None   Social History Narrative  .  None     Vitals:   04/22/17 0759  BP: 138/80  Pulse: 76  Resp: 12   Body mass index is 31.47 kg/m.  O2 sat at RA 97%  Wt Readings from Last 3 Encounters:  04/22/17 245 lb 2 oz (111.2 kg)  04/26/16 237 lb 6 oz (107.7 kg)  07/29/13 233 lb 9.6 oz (106 kg)     Physical Exam  Nursing note and vitals reviewed. Constitutional: He is oriented to person, place, and time. He  appears well-developed. No distress.  HENT:  Head: Atraumatic.  Right Ear: Tympanic membrane, external ear and ear canal normal.  Left Ear: Tympanic membrane, external ear and ear canal normal.  Mouth/Throat: Oropharynx is clear and moist and mucous membranes are normal.  Eyes: Conjunctivae and EOM are normal. Pupils are equal, round, and reactive to light.  Neck: Normal range of motion. No tracheal deviation present. No thyroid mass and no thyromegaly present.  Cardiovascular: Normal rate and regular rhythm.   No murmur heard. Pulses:      Dorsalis pedis pulses are 2+ on the right side, and 2+ on the left side.  Respiratory: Effort normal and breath sounds normal. No respiratory distress.  GI: Soft. He exhibits no mass. There is no tenderness. Hernia confirmed negative in the right inguinal area and confirmed negative in the left inguinal area.  Genitourinary: Testes normal and penis normal. Circumcised.  Musculoskeletal: He exhibits no edema or tenderness.  No major deformities appreciated and no signs of synovitis.  Lymphadenopathy:    He has no cervical adenopathy.       Right: No inguinal and no supraclavicular adenopathy present.       Left: No inguinal and no supraclavicular adenopathy present.  Neurological: He is alert and oriented to person, place, and time. He has normal strength. No cranial nerve deficit or sensory deficit. Coordination and gait normal.  Reflex Scores:      Bicep reflexes are 2+ on the right side and 2+ on the left side.      Patellar reflexes are 2+ on the right side and 2+ on the left side. Skin: Skin is warm. No rash noted. No erythema.  Psychiatric: He has a normal mood and affect. Cognition and memory are normal.  Well groomed, good eye contact.     ASSESSMENT AND PLAN:   [name] was seen today for annual exam.  Diagnoses and all orders for this visit:  Routine general medical examination at a health care facility  We discussed the  importance of regular physical activity and healthy diet for prevention of chronic illness and/or complications. Preventive guidelines reviewed. STD prevention discussed. Vaccination up to date.  Next CPE in 1 year.   -     Basic metabolic panel -     Lipid panel -     Hemoglobin A1c  Lipid screening -     Lipid panel  Diabetes mellitus screening -     Basic metabolic panel -     Hemoglobin A1c  Erectile dysfunction, unspecified erectile dysfunction type  Possible etiologies discussed. For now I do not think pharmacologic treatment is necessary. Further recommendations will be given according to lab results.  -     TSH -     PSA(Must document that pt has been informed of limitations of PSA testing.)  Prostate cancer screening  Current recommendations reviewed, he agrees with screening. Because + FHx, father Dx with prostate cancer in his mid 80's.  -  PSA(Must document that pt has been informed of limitations of PSA testing.)  Encounter for screening for HIV -     HIV antibody    Return in 1 year (on 04/22/2018) for CPE.    Malaina Mortellaro G. Martinique, MD  [facility]. [place] office.

## 2017-04-22 ENCOUNTER — Encounter: Payer: Self-pay | Admitting: Family Medicine

## 2017-04-22 ENCOUNTER — Ambulatory Visit (INDEPENDENT_AMBULATORY_CARE_PROVIDER_SITE_OTHER): Payer: 59 | Admitting: Family Medicine

## 2017-04-22 VITALS — BP 138/80 | HR 76 | Resp 12 | Ht 74.0 in | Wt 245.1 lb

## 2017-04-22 DIAGNOSIS — N529 Male erectile dysfunction, unspecified: Secondary | ICD-10-CM | POA: Diagnosis not present

## 2017-04-22 DIAGNOSIS — Z125 Encounter for screening for malignant neoplasm of prostate: Secondary | ICD-10-CM | POA: Diagnosis not present

## 2017-04-22 DIAGNOSIS — Z114 Encounter for screening for human immunodeficiency virus [HIV]: Secondary | ICD-10-CM

## 2017-04-22 DIAGNOSIS — Z0000 Encounter for general adult medical examination without abnormal findings: Secondary | ICD-10-CM | POA: Diagnosis not present

## 2017-04-22 DIAGNOSIS — Z13220 Encounter for screening for lipoid disorders: Secondary | ICD-10-CM

## 2017-04-22 DIAGNOSIS — Z131 Encounter for screening for diabetes mellitus: Secondary | ICD-10-CM | POA: Diagnosis not present

## 2017-04-22 LAB — BASIC METABOLIC PANEL
BUN: 12 mg/dL (ref 6–23)
CHLORIDE: 107 meq/L (ref 96–112)
CO2: 23 meq/L (ref 19–32)
Calcium: 8.9 mg/dL (ref 8.4–10.5)
Creatinine, Ser: 1.24 mg/dL (ref 0.40–1.50)
GFR: 80.44 mL/min (ref >60.00)
Glucose, Bld: 99 mg/dL (ref 70–99)
Potassium: 3.8 mEq/L (ref 3.5–5.1)
SODIUM: 138 meq/L (ref 135–145)

## 2017-04-22 LAB — PSA: PSA: 0.66 ng/mL (ref 0.10–4.00)

## 2017-04-22 LAB — LIPID PANEL
CHOL/HDL RATIO: 5
Cholesterol: 199 mg/dL (ref 0–200)
HDL: 37.8 mg/dL — ABNORMAL LOW (ref >39.00)
LDL CALC: 137 mg/dL — AB (ref 0–99)
NonHDL: 161
Triglycerides: 121 mg/dL (ref 0.0–149.0)
VLDL: 24.2 mg/dL (ref 0.0–40.0)

## 2017-04-22 LAB — TSH: TSH: 1.54 u[IU]/mL (ref 0.35–4.50)

## 2017-04-22 LAB — HEMOGLOBIN A1C: Hgb A1c MFr Bld: 5.5 % (ref 4.6–6.5)

## 2017-04-22 NOTE — Patient Instructions (Signed)
A few things to remember from today's visit:   Routine general medical examination at a health care facility - Plan: Basic metabolic panel, Lipid panel, Hemoglobin A1c  Lipid screening - Plan: Lipid panel  Diabetes mellitus screening - Plan: Basic metabolic panel, Hemoglobin A1c  Erectile dysfunction, unspecified erectile dysfunction type - Plan: TSH, PSA(Must document that pt has been informed of limitations of PSA testing.)  Prostate cancer screening - Plan: PSA(Must document that pt has been informed of limitations of PSA testing.)  Encounter for screening for HIV - Plan: HIV antibody    At least 150 minutes of moderate exercise per week, daily brisk walking for 15-30 min is a good exercise option. Healthy diet low in saturated (animal) fats and sweets and consisting of fresh fruits and vegetables, lean meats such as fish and white chicken and whole grains.  - Vaccines:  Tdap vaccine every 10 years.  Shingles vaccine recommended at age 27, could be given after 47 years of age but not sure about insurance coverage.  Pneumonia vaccines:  Prevnar 40 at 7 and Pneumovax at 93.   -Screening recommendations for low/normal risk males:  Screening for diabetes at age 64-45 and every 3 years.    Lipid screening at 35 and every 3 years.   Colonoscopy for colon cancer screening at age 70 and until age 47.  Prostate cancer screening: some controversy.        Please be sure medication list is accurate. If a new problem present, please set up appointment sooner than planned today.

## 2017-04-23 LAB — HIV ANTIBODY (ROUTINE TESTING W REFLEX): HIV 1&2 Ab, 4th Generation: NONREACTIVE

## 2017-04-26 ENCOUNTER — Encounter: Payer: Self-pay | Admitting: Family Medicine

## 2017-07-04 ENCOUNTER — Encounter: Payer: Self-pay | Admitting: Family Medicine

## 2018-04-22 DIAGNOSIS — E785 Hyperlipidemia, unspecified: Secondary | ICD-10-CM | POA: Insufficient documentation

## 2018-04-22 NOTE — Progress Notes (Deleted)
HPI:  [name] is a 47 y.o.male here today for his routine physical examination.  Last CPE: 04/22/17 He lives with his girlfriend.  Regular exercise 3 or more times per week: *** Following a healthy diet: ***   Chronic medical problems: Back pain,allergies,OSA on CPAP,and obesity.  Hx of STD's: Dalbert Batman many years ago.   There is no immunization history on file for this patient.    -Hep C screening (if born 40-1966): ***   Last colon cancer screening: *** Last prostate ca screening: ***  -Denies high alcohol intake, tobacco use, or Hx of illicit drug use.  -Concerns and/or follow up today: ***    Review of Systems   Current Outpatient Medications on File Prior to Visit  Medication Sig Dispense Refill  . cetirizine (ZYRTEC) 10 MG tablet Take 10 mg by mouth daily.    . Multiple Vitamin (MULTIVITAMIN WITH MINERALS) TABS tablet Take 1 tablet by mouth daily.     No current facility-administered medications on file prior to visit.      Past Medical History:  Diagnosis Date  . Appendicitis   . OSA on CPAP   . Vertigo     Past Surgical History:  Procedure Laterality Date  . APPENDECTOMY  01/15/11    No Known Allergies  Family History  Problem Relation Age of Onset  . Breast cancer Mother   . Cancer Mother        breast  . Prostate cancer Father   . Cancer Father 48       prostate  . Diabetes Neg Hx     Social History   Socioeconomic History  . Marital status: Single    Spouse name: Not on file  . Number of children: Not on file  . Years of education: Not on file  . Highest education level: Not on file  Occupational History  . Not on file  Social Needs  . Financial resource strain: Not on file  . Food insecurity:    Worry: Not on file    Inability: Not on file  . Transportation needs:    Medical: Not on file    Non-medical: Not on file  Tobacco Use  . Smoking status: Never Smoker  . Smokeless tobacco: Never Used    Substance and Sexual Activity  . Alcohol use: Yes    Comment: OCCASSIONAL  . Drug use: No  . Sexual activity: Not Currently  Lifestyle  . Physical activity:    Days per week: Not on file    Minutes per session: Not on file  . Stress: Not on file  Relationships  . Social connections:    Talks on phone: Not on file    Gets together: Not on file    Attends religious service: Not on file    Active member of club or organization: Not on file    Attends meetings of clubs or organizations: Not on file    Relationship status: Not on file  Other Topics Concern  . Not on file  Social History Narrative  . Not on file     There were no vitals filed for this visit. There is no height or weight on file to calculate BMI.   Wt Readings from Last 3 Encounters:  04/22/17 245 lb 2 oz (111.2 kg)  04/26/16 237 lb 6 oz (107.7 kg)  07/29/13 233 lb 9.6 oz (106 kg)      Physical Exam  ASSESSMENT AND PLAN:    There are no diagnoses linked to this encounter.       No follow-ups on file.    Dirck Butch G. Martinique, MD  [facility]. [place] [facility].

## 2018-04-23 ENCOUNTER — Encounter: Payer: 59 | Admitting: Family Medicine

## 2018-04-23 DIAGNOSIS — Z0289 Encounter for other administrative examinations: Secondary | ICD-10-CM

## 2019-11-05 ENCOUNTER — Ambulatory Visit: Payer: 59 | Attending: Internal Medicine

## 2019-11-05 ENCOUNTER — Other Ambulatory Visit: Payer: Self-pay

## 2019-11-05 DIAGNOSIS — Z20822 Contact with and (suspected) exposure to covid-19: Secondary | ICD-10-CM

## 2019-11-06 LAB — NOVEL CORONAVIRUS, NAA: SARS-CoV-2, NAA: NOT DETECTED

## 2019-12-31 ENCOUNTER — Other Ambulatory Visit (HOSPITAL_COMMUNITY): Payer: Self-pay | Admitting: Family Medicine

## 2019-12-31 DIAGNOSIS — I739 Peripheral vascular disease, unspecified: Secondary | ICD-10-CM

## 2020-01-04 ENCOUNTER — Ambulatory Visit (HOSPITAL_COMMUNITY)
Admission: RE | Admit: 2020-01-04 | Discharge: 2020-01-04 | Disposition: A | Discharge status: Home / Self Care | Payer: Self-pay | Source: Ambulatory Visit | Attending: Family Medicine | Admitting: Family Medicine

## 2020-01-04 ENCOUNTER — Other Ambulatory Visit: Payer: Self-pay

## 2020-01-04 DIAGNOSIS — I739 Peripheral vascular disease, unspecified: Secondary | ICD-10-CM | POA: Insufficient documentation

## 2020-01-04 NOTE — Progress Notes (Deleted)
VASCULAR LAB PRELIMINARY  PRELIMINARY  PRELIMINARY  PRELIMINARY  ABIs completed.    Preliminary report:  See CV proc for preliminary results.  Colonel Krauser, RVT 01/04/2020, 3:42 PM

## 2020-01-04 NOTE — Progress Notes (Signed)
VASCULAR LAB PRELIMINARY  PRELIMINARY  PRELIMINARY  PRELIMINARY  ABIs completed.    Preliminary report:  See CV proc for preliminary results.   Kristyne Woodring, RVT 01/04/2020, 3:44 PM

## 2021-02-06 ENCOUNTER — Telehealth (HOSPITAL_COMMUNITY): Payer: Self-pay | Admitting: Psychiatry

## 2021-02-06 NOTE — Telephone Encounter (Signed)
D:  Returned pt's call re: MH-IOP.  A:  Oriented pt.  Pt will have a CCA done on 02-09-21 @ 10 a.m., start MH-IOP on 02-13-21 @ 9 a.m.  Encouraged pt to verify his benefits.  R:  Pt receptive.

## 2021-02-09 ENCOUNTER — Other Ambulatory Visit: Payer: Self-pay

## 2021-02-09 ENCOUNTER — Other Ambulatory Visit (HOSPITAL_COMMUNITY): Payer: BC Managed Care – PPO | Attending: Psychiatry | Admitting: Psychiatry

## 2021-02-09 NOTE — Progress Notes (Signed)
Comprehensive Clinical Assessment (CCA) Note  02/09/2021 Isaac Vasquez [id number]  Chief Complaint:  Chief Complaint  Patient presents with  . Anxiety  . Depression   Visit Diagnosis: F33.2   CCA Screening, Triage and Referral (STR)  Patient Reported Information How did you hear about [place]? Other (Comment) (HR)  Referral name: No data recorded Referral phone number: No data recorded  Whom do you see for routine medical problems? Primary Care  Practice/Facility Name: [facility]  Practice/Facility Phone Number: No data recorded Name of Contact: No data recorded Contact Number: No data recorded Contact Fax Number: No data recorded Prescriber Name: Dr. Lucianne Lei  Prescriber Address (if known): No data recorded  What Is the Reason for Your Visit/Call Today? Anxiety and Depression  How Long Has This Been Causing You Problems? 1 wk - 1 month  What Do You Feel Would Help You the Most Today? Treatment for Depression or other mood problem; Stress Management   Have You Recently Been in Any Inpatient Treatment (Hospital/Detox/Crisis Center/28-Day Program)? No  Name/Location of Program/Hospital:No data recorded How Long Were You There? No data recorded When Were You Discharged? No data recorded  Have You Ever Received Services From [facility] Before? No  Who Do You See at [facility]? No data recorded  Have You Recently Had Any Thoughts About Hurting Yourself? No  Are You Planning to Commit Suicide/Harm Yourself At This time? No   Have you Recently Had Thoughts About [place]? No  Explanation: No data recorded  Have You Used Any Alcohol or Drugs in the Past 24 Hours? No  How Long Ago Did You Use Drugs or Alcohol? No data recorded What Did You Use and How Much? No data recorded  Do You Currently Have a Therapist/Psychiatrist? No  Name of Therapist/Psychiatrist: No data recorded  Have You Been Recently Discharged From Any Office Practice or  Programs? No  Explanation of Discharge From Practice/Program: No data recorded    CCA Screening Triage Referral Assessment Type of Contact: No data recorded Is this Initial or Reassessment? No data recorded Date Telepsych consult ordered in [facility]:  No data recorded Time Telepsych consult ordered in [facility]:  No data recorded  Patient Reported Information Reviewed? No data recorded Patient Left Without Being Seen? No data recorded Reason for Not Completing Assessment: No data recorded  Collateral Involvement: No data recorded  Does Patient Have a [place]? No data recorded Name and Contact of Legal Guardian: No data recorded If Minor and Not Living with Parent(s), Who has Custody? No data recorded Is CPS involved or ever been involved? Never  Is APS involved or ever been involved? Never   Patient Determined To Be At Risk for Harm To Self or Others Based on Review of Patient Reported Information or Presenting Complaint? No  Method: No data recorded Availability of Means: No data recorded Intent: No data recorded Notification Required: No data recorded Additional Information for Danger to Others Potential: No data recorded Additional Comments for Danger to Others Potential: No data recorded Are There Guns or Other Weapons in Your Home? No data recorded Types of Guns/Weapons: No data recorded Are These Weapons Safely Secured?                            No data recorded Who Could Verify You Are Able To Have These Secured: No data recorded Do You Have any Outstanding Charges, Pending Court Dates,  Parole/Probation? No data recorded Contacted To Inform of Risk of Harm To Self or Others: No data recorded  Location of Assessment: No data recorded  Does Patient Present under Involuntary Commitment? No  IVC Papers Initial File Date: No data recorded  [place] of Residence: [place]   Patient Currently Receiving the Following Services: IOP (Intensive Outpatient  Program)   Determination of Need: Routine (7 days)   Options For Referral: Intensive Outpatient Therapy     CCA Biopsychosocial Intake/Chief Complaint:  This is 50 yr old, single, employed, [place] American male, who was a self referral.  Reports increased anxiety and depression for over one yr.  Stressor/Trigger:  1) Job ([employer]) of 13 yrs.  States he had left the job and found another one in his field, but ended up returning in 2015.  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  The metrics are impossible to meet.  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  "As of 12-27-20, I had 196 hrs of vacation time as of today I have none.  That shows how difficult it's been for me b/c I've been taking days.  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  Pt denies any previous inpatient psych stays or outpt treatment.  Denies any prior suicide attempts or gestures.  Denies any family hx.  Reports family as his support system.  He and his girlfriend resides together.  Current Symptoms/Problems: decreased energy, no motivation, decreased self-esteem, poor concentration, irritability, decreased appetite, anhedonia, sadness, tearfulness, isolative, poor sleep, ruminating thoughts, feelings of worthlessness, helplessness and hopelessness, anxious   Patient Reported Schizophrenia/Schizoaffective Diagnosis in Past: No   Strengths: "I am a good listener and I always see both sides of a situation."  Preferences: "I need to work on saying no."  Abilities: No data recorded  Type of Services Patient Feels are Needed: MH-IOP   Initial Clinical Notes/Concerns: No data recorded  Mental Health Symptoms Depression:  Change in energy/activity; Difficulty Concentrating; Fatigue; Hopelessness; Increase/decrease in appetite;  Irritability; Sleep (too much or little); Tearfulness; Worthlessness   Duration of Depressive symptoms: Greater than two weeks   Mania:  N/A   Anxiety:   Difficulty concentrating; Restlessness; Irritability; Worrying   Psychosis:  None   Duration of Psychotic symptoms: No data recorded  Trauma:  N/A   Obsessions:  N/A   Compulsions:  N/A   Inattention:  N/A   Hyperactivity/Impulsivity:  N/A   Oppositional/Defiant Behaviors:  N/A   Emotional Irregularity:  N/A   Other Mood/Personality Symptoms:  No data recorded   Mental Status Exam Appearance and self-care  Stature:  Tall   Weight:  Overweight   Clothing:  Casual   Grooming:  Normal   Cosmetic use:  None   Posture/gait:  Normal   Motor activity:  Not Remarkable   Sensorium  Attention:  Normal   Concentration:  Normal   Orientation:  X5   Recall/memory:  Normal   Affect and Mood  Affect:  Appropriate   Mood:  Anxious   Relating  Eye contact:  Normal   Facial expression:  Anxious   Attitude toward examiner:  Cooperative   Thought and Language  Speech flow: Normal   Thought content:  Appropriate to Mood and Circumstances  Preoccupation:  None   Hallucinations:  None   Organization:  No data recorded  [employer] of Knowledge:  Average   Intelligence:  Average   Abstraction:  Normal   Judgement:  Good   Reality Testing:  Adequate   Insight:  Good   Decision Making:  Normal   Social Functioning  Social Maturity:  No data recorded  Social Judgement:  Normal   Stress  Stressors:  Work; Teacher, music Ability:  Programme researcher, broadcasting/film/video Deficits:  Communication; Interpersonal   Supports:  Family     Religion: Religion/Spirituality Are You A Religious Person?: Yes What is Your Religious Affiliation?: Non-Denominational  Leisure/Recreation: Leisure / Recreation Do You Have Hobbies?: Yes Leisure and Hobbies: wt training, listening to music, gardening,  riding bike  Exercise/Diet: Exercise/Diet Do You Exercise?: Yes What Type of Exercise Do You Do?: Weight Training How Many Times a Week Do You Exercise?: 4-5 times a week (hasn't been to gym in three months.) Have You Gained or Lost A Significant Amount of Weight in the Past Six Months?: No Do You Follow a Special Diet?: No Do You Have Any Trouble Sleeping?: Yes Explanation of Sleeping Difficulties: ruminating thoughts   CCA Employment/Education Employment/Work Situation: Employment / Work Situation Employment situation: Employed Where is patient currently employed?: [employer] How long has patient been employed?: 13 yrs (returned back in 2015) Patient's job has been impacted by current illness: Yes Describe how patient's job has been impacted: Difficulty meeting the unreasonable metrics.  "I feel like I'm in a pressure cooker." Has patient ever been in the [employer]?: Yes (Describe in comment) Education officer, community for 8 yrs; honorably discharged)  Education: Education Is Patient Currently Attending School?: No Did Teacher, adult education From [employer]?: Yes Did Physicist, medical?: Yes What Type of College Degree Do you Have?: Full basketball scholarship to a college Did [place]?: No What Was Your Major?: Business/Finance Did You Have An Individualized Education Program (IIEP): No Did You Have Any Difficulty At School?: No Patient's Education Has Been Impacted by Current Illness: No   CCA Family/Childhood History Family and Relationship History: Family history Marital status: Long term relationship Long term relationship, how long?: a couple of years What types of issues is patient dealing with in the relationship?: n/a Are you sexually active?: No What is your sexual orientation?: heterosexual Has your sexual activity been affected by drugs, alcohol, medication, or emotional stress?: affected by emotional stress Does patient have children?: Yes How many children?: 3 How is  patient's relationship with their children?: all boys...ages 42, 69, 17 (with mothers, but see them often)  Childhood History:  Childhood History By whom was/is the patient raised?: Both parents Additional childhood history information: Born in [place], [place]; raised all over d/t father being in [employer].  Moved around a lot.  "Great childhood. 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 Description of patient's relationship with caregiver when they were a child: Close to both parents. Patient's description of current relationship with people who raised him/her: Closer to father b/c he resides here, mother resides in [place]. Does patient have siblings?: Yes Number of Siblings: 2 Description of patient's current relationship with siblings: older brother and younger sister.  Both reside in [place].  Pt is closer to sister. Did patient suffer any verbal/emotional/physical/sexual abuse as a child?: No Did patient suffer from severe childhood neglect?: No Has patient ever  been sexually abused/assaulted/raped as an adolescent or adult?: No Was the patient ever a victim of a crime or a disaster?: No Witnessed domestic violence?: No Has patient been affected by domestic violence as an adult?: No  Child/Adolescent Assessment:     CCA Substance Use Alcohol/Drug Use: Alcohol / Drug Use Pain Medications: cc: MAR Prescriptions: allergy medicine Over the Counter: cc: MAR History of alcohol / drug use?: No history of alcohol / drug abuse                         ASAM's:  Six Dimensions of Multidimensional Assessment  Dimension 1:  Acute Intoxication and/or Withdrawal Potential:      Dimension 2:  Biomedical Conditions and Complications:      Dimension 3:  Emotional, Behavioral, or Cognitive Conditions and Complications:     Dimension 4:  Readiness to Change:     Dimension 5:  Relapse, Continued use, or Continued Problem Potential:      Dimension 6:  Recovery/Living Environment:     ASAM Severity Score:    ASAM Recommended Level of Treatment:     Substance use Disorder (SUD)    Recommendations for Services/Supports/Treatments: Recommendations for Services/Supports/Treatments Recommendations For Services/Supports/Treatments: IOP (Intensive Outpatient Program)  DSM5 Diagnoses: Patient Active Problem List   Diagnosis Date Noted  . Dyslipidemia (high LDL; low HDL) 04/22/2018    Patient Centered Plan: Patient is on the following Treatment Plan(s):  Anxiety and Depression Pt will improve his mood as evidenced by being happy again, managing his mood and coping with daily stressors for 5 out of 7 days for 60 days. Pt gave verbal consent for treatment, to release chart information to referred providers and to complete any forms if needed.  Pt also gave consent for attending group virtually d/t COVID-19 social distancing restrictions.  Encouraged support groups.  Will refer pt to a therapist and psychiatrist.  Referrals to Alternative Service(s): Referred to Alternative Service(s):   Place:   Date:   Time:    Referred to Alternative Service(s):   Place:   Date:   Time:    Referred to Alternative Service(s):   Place:   Date:   Time:    Referred to Alternative Service(s):   Place:   Date:   Time:     Dellia Nims, M.Ed,CNA

## 2021-02-13 ENCOUNTER — Other Ambulatory Visit (HOSPITAL_COMMUNITY): Payer: BC Managed Care – PPO | Attending: Psychiatry | Admitting: Psychiatry

## 2021-02-13 ENCOUNTER — Encounter (HOSPITAL_COMMUNITY): Payer: Self-pay | Admitting: Psychiatry

## 2021-02-13 ENCOUNTER — Other Ambulatory Visit: Payer: Self-pay

## 2021-02-13 DIAGNOSIS — Z8616 Personal history of COVID-19: Secondary | ICD-10-CM | POA: Diagnosis not present

## 2021-02-13 DIAGNOSIS — F4323 Adjustment disorder with mixed anxiety and depressed mood: Secondary | ICD-10-CM

## 2021-02-13 DIAGNOSIS — F419 Anxiety disorder, unspecified: Secondary | ICD-10-CM | POA: Diagnosis not present

## 2021-02-13 DIAGNOSIS — F4321 Adjustment disorder with depressed mood: Secondary | ICD-10-CM | POA: Insufficient documentation

## 2021-02-13 NOTE — Progress Notes (Signed)
Virtual Visit via Video Note  I connected with Isaac Vasquez on 02/13/21 at  9:00 AM EDT by a video enabled telemedicine application and verified that I am speaking with the correct person using two identifiers.  At orientation to the IOP program, Case Manager discussed the limitations of evaluation and management by telemedicine and the availability of in person appointments. The patient expressed understanding and agreed to proceed with virtual visits throughout the duration of the program.   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 Client stated needs and engaged in discussion. Case Manager introduced a new Client to the group with the group members beginning the joining process.   Initial Therapeutic Activity: Counselor facilitated a check-in with group members to assess mood and current functioning. Client shared details of their mental health management since our last session, including challenges and successes. Counselor engaged group in discussion, covering the following topics: work related stressors, coping strategies, relational issues and utilizing support systems. Client presents with moderate depression and moderate anxiety. Client denied any current SI/HI/psychosis.   Second Therapeutic Activity: Counselor engaged the group in identifying current mental health symptoms and how structure and problem solve to reduce and alleviate symptoms. Group members shared ideas and needs in these areas. Counselor provided psychoeducation on coping strategies and skills. Client engaged well in discussion and problem solving.  Check Out: Counselor prompted group members to share what self-care practice or productivity  activity they will engage in today. Client plans to resting and relaxing. Client endorsed safety plan to be followed to prevent safety issues.   Assessment and Plan: Clinician recommends that Client remain in IOP treatment to better manage mental health symptoms, stabilization and to address treatment plan goals. Clinician recommends adherence to crisis/safety plan, taking medications as prescribed, and following up with medical professionals if any issues arise.   Follow Up Instructions: Clinician will send Webex link for next session. The Client was advised to call back or seek an in-person evaluation if the symptoms worsen or if the condition fails to improve as anticipated.     I provided 180 minutes of non-face-to-face time during this encounter.     Lise Auer, LCSW

## 2021-02-14 ENCOUNTER — Other Ambulatory Visit (HOSPITAL_COMMUNITY): Payer: BC Managed Care – PPO | Admitting: Psychiatry

## 2021-02-14 ENCOUNTER — Other Ambulatory Visit: Payer: Self-pay

## 2021-02-14 ENCOUNTER — Encounter (HOSPITAL_COMMUNITY): Payer: Self-pay | Admitting: Psychiatry

## 2021-02-14 DIAGNOSIS — F4321 Adjustment disorder with depressed mood: Secondary | ICD-10-CM | POA: Diagnosis not present

## 2021-02-14 DIAGNOSIS — F4323 Adjustment disorder with mixed anxiety and depressed mood: Secondary | ICD-10-CM

## 2021-02-15 ENCOUNTER — Other Ambulatory Visit: Payer: Self-pay

## 2021-02-15 ENCOUNTER — Encounter (HOSPITAL_COMMUNITY): Payer: Self-pay

## 2021-02-15 ENCOUNTER — Other Ambulatory Visit (HOSPITAL_COMMUNITY): Payer: BC Managed Care – PPO | Admitting: Psychiatry

## 2021-02-15 ENCOUNTER — Encounter (HOSPITAL_COMMUNITY): Payer: Self-pay | Admitting: Psychiatry

## 2021-02-15 DIAGNOSIS — F4323 Adjustment disorder with mixed anxiety and depressed mood: Secondary | ICD-10-CM

## 2021-02-15 DIAGNOSIS — F4321 Adjustment disorder with depressed mood: Secondary | ICD-10-CM | POA: Diagnosis not present

## 2021-02-15 NOTE — Progress Notes (Signed)
Virtual Visit via Video Note  I connected with Isaac Vasquez on 02/15/21 at  9:00 AM EDT by a video enabled telemedicine application and verified that I am speaking with the correct person using two identifiers.  At orientation to the IOP program, Case Manager discussed the limitations of evaluation and management by telemedicine and the availability of in person appointments. The patient expressed understanding and agreed to proceed with virtual visits throughout the duration of the program.   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 Client stated needs and engaged in discussion. Case Manager introduced a new Client to the group with the group members beginning the joining process.   Initial Therapeutic Activity: Counselor facilitated a check-in with group members to assess mood and current functioning. Client shared details of their mental health management since our last session, including challenges and successes. Counselor engaged group in discussion, covering the following topics: work related stressors, coping strategies, relational issues and utilizing support systems. Client presents with moderate depression and moderate anxiety. Client denied any current SI/HI/psychosis.   Second Therapeutic Activity: Counselor reviewed completed Safety/Crisis Plans with group members who started theirs on Friday. Counselor engaged new group members in completing full document. Counselor prompted group members to share their plans with the group to generate ideas and feedback on thoroughness/effectiveness. Counselor encouraged group members to share their plans with their support system and to have a final drafted copy readily  available to access to prevent MH crisis. Client engaged well in activity and discussions. Client intends to shared with appropriate people.    Check Out: Counselor closed program by allowing time to celebrate a graduating group member. Counselor shared reflections on progress and allow space for group members to share well wishes and appreciates to the graduating client. Counselor prompted graduating client to share takeaways, reflect on progress and final thoughts for the group. Client endorsed safety plan to be followed to prevent safety issues.   Assessment and Plan: Clinician recommends that Client remain in IOP treatment to better manage mental health symptoms, stabilization and to address treatment plan goals. Clinician recommends adherence to crisis/safety plan, taking medications as prescribed, and following up with medical professionals if any issues arise.   Follow Up Instructions: Clinician will send Webex link for next session. The Client was advised to call back or seek an in-person evaluation if the symptoms worsen or if the condition fails to improve as anticipated.     I provided 180 minutes of non-face-to-face time during this encounter.     Lise Auer, LCSW

## 2021-02-15 NOTE — Progress Notes (Signed)
Virtual Visit via Video Note  I connected with Isaac Vasquez on 02/15/21 at  9:00 AM EDT by a video enabled telemedicine application and verified that I am speaking with the correct person using two identifiers.  Location: Patient: Home Provider: Office   I discussed the limitations of evaluation and management by telemedicine and the availability of in person appointments. The patient expressed understanding and agreed to proceed.    I discussed the assessment and treatment plan with the patient. The patient was provided an opportunity to ask questions and all were answered. The patient agreed with the plan and demonstrated an understanding of the instructions.   The patient was advised to call back or seek an in-person evaluation if the symptoms worsen or if the condition fails to improve as anticipated.  I provided 15 minutes of non-face-to-face time during this encounter.   Derrill Center, NP    Psychiatric Initial Adult Assessment   Patient Identification: Isaac Vasquez MRN:  [id number] Date of Evaluation:  02/15/2021 Referral Source: Coworker/ Peer Referral Chief Complaint:   Worsening depression and anxiety  Visit Diagnosis: No diagnosis found.  History of Present Illness:  Isaac Vasquez is a 50 year old single African-American male who presents with worsening depression and anxiety related to multiple stressors.  Reports his primary stressors is related to his current employer.  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  Reports he has been employed with [employer] for the past 13 years and recently he has noticed that the company has become skills focused which caused undue stress.  States he feels that his Risk manager him often.  States that he was recently diagnosed with COVID and continues to have set expectation of self/service goals that are unattainable.  Ranveer denied previous inpatient  admissions.  Denied suicidal or homicidal ideations.  Denies auditory or visual hallucinations.  Denied history of self injures behaviors.  Denied history of physical or sexual abuse.  Denied that he is currently followed by therapist and/or psychiatrist.  Denies that he is prescribed any psychotropic medications.  Denied family history with mental illness.  Denies illicit drug use and/or substance abuse history.  Rankin reports he has 3 children and currently  resides alone.  Reports poor concentration, ruminations, mood irritability and poor sleep.  He declined initiating any medications at this time.  Patient to start intensive outpatient programming on 02/14/2019 2022  Associated Signs/Symptoms: Depression Symptoms:  depressed mood, anxiety, disturbed sleep, (Hypo) Manic Symptoms:  Distractibility, Irritable Mood, Anxiety Symptoms:  Excessive Worry, Psychotic Symptoms:  Hallucinations: None PTSD Symptoms: NA  Past Psychiatric History: Denied  Previous Psychotropic Medications: No   Substance Abuse History in the last 12 months:  No.  Consequences of Substance Abuse: NA  Past Medical History:  Past Medical History:  Diagnosis Date  . Anxiety   . Appendicitis   . Depression   . OSA on CPAP   . Vertigo     Past Surgical History:  Procedure Laterality Date  . APPENDECTOMY  01/15/11    Family Psychiatric History:   Family History:  Family History  Problem Relation Age of Onset  . Breast cancer Mother   . Cancer Mother        breast  . Prostate cancer Father   . Cancer Father 86       prostate  . Diabetes Neg Hx     Social History:   Social  History   Socioeconomic History  . Marital status: Single    Spouse name: Not on file  . Number of children: 3  . Years of education: Not on file  . Highest education level: Not on file  Occupational History  . Not on file  Tobacco Use  . Smoking status: Never Smoker  . Smokeless tobacco: Never Used  Vaping Use  .  Vaping Use: Never used  Substance and Sexual Activity  . Alcohol use: Yes    Comment: OCCASSIONAL  . Drug use: No  . Sexual activity: Not Currently  Other Topics Concern  . Not on file  Social History Narrative  . Not on file   Social Determinants of Health   Financial Resource Strain: Not on file  Food Insecurity: Not on file  Transportation Needs: Not on file  Physical Activity: Not on file  Stress: Not on file  Social Connections: Not on file    Additional Social History:   Allergies:  No Known Allergies  Metabolic Disorder Labs: Lab Results  Component Value Date   HGBA1C 5.5 04/22/2017   No results found for: PROLACTIN Lab Results  Component Value Date   CHOL 199 04/22/2017   TRIG 121.0 04/22/2017   HDL 37.80 (L) 04/22/2017   CHOLHDL 5 04/22/2017   VLDL 24.2 04/22/2017   LDLCALC 137 (H) 04/22/2017   Lab Results  Component Value Date   TSH 1.54 04/22/2017    Therapeutic Level Labs: No results found for: LITHIUM No results found for: CBMZ No results found for: VALPROATE  Current Medications: Current Outpatient Medications  Medication Sig Dispense Refill  . cetirizine (ZYRTEC) 10 MG tablet Take 10 mg by mouth daily.    . Multiple Vitamin (MULTIVITAMIN WITH MINERALS) TABS tablet Take 1 tablet by mouth daily.     No current facility-administered medications for this visit.    Musculoskeletal: Strength & Muscle Tone: within normal limits Gait & Station: normal Patient leans: N/A  Psychiatric Specialty Exam: Review of Systems  There were no vitals taken for this visit.There is no height or weight on file to calculate BMI.  General Appearance: Casual  Eye Contact:  Good  Speech:  Clear and Coherent  Volume:  Normal  Mood:  Anxious and Depressed  Affect:  Congruent  Thought Process:  Coherent  Orientation:  Full (Time, Place, and Person)  Thought Content:  Hallucinations: None  Suicidal Thoughts:  No  Homicidal Thoughts:  No  Memory:   Immediate;   Fair Recent;   Fair  Judgement:  Good  Insight:  Good  Psychomotor Activity:  Normal  Concentration:  Concentration: Fair  Recall:  Good  Fund of Knowledge:Fair  Language: Fair  Akathisia:  No  Handed:  Right  AIMS (if indicated):   Assets:  Communication Skills Desire for Improvement Resilience Social Support  ADL's:  Intact  Cognition: WNL  Sleep:  Fair   Screenings: Science writer from 02/09/2021 in [place]  PHQ-2 Total Score 3  PHQ-9 Total Score 12    Flowsheet Row Counselor from 02/13/2021 in [place] Counselor from 02/09/2021 in [place] Error: Question 6 not populated Error: Question 6 not populated      Assessment and Plan:  Kaiden to start Intensive Outpatient Programming (IOP)  Treatment plan was reviewed and agreed upon by Np T.Lewisn and patient Levante Simones Vasquez need for continued group services.  Derrill Center, NP 5/4/202210:02 AM

## 2021-02-15 NOTE — Progress Notes (Signed)
Virtual Visit via Video Note  I connected with Earle Gell II on 02/15/21 at  9:00 AM EDT by a video enabled telemedicine application and verified that I am speaking with the correct person using two identifiers.  At orientation to the IOP program, Case Manager discussed the limitations of evaluation and management by telemedicine and the availability of in person appointments. The patient expressed understanding and agreed to proceed with virtual visits throughout the duration of the program.   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 Client stated needs and engaged in discussion. Case Manager introduced a new Client to the group with the group members beginning the joining process.   Initial Therapeutic Activity: Counselor facilitated a check-in with group members to assess mood and current functioning. Client shared details of their mental health management since our last session, including challenges and successes. Counselor engaged group in discussion, covering the following topics: mood stabilization, anxiety, depression, advocacy and discharge planning. Client presents with mild depression and moderate anxiety. Client denied any current SI/HI/psychosis.    Second Therapeutic Activity: Counselor introduced our guest speaker, Einar Grad, [employer] Pharmacist, who shared about psychiatric medications, side effects, treatment considerations and how to communicate with medical professionals. Group Members asked questions and shared medication concerns. Counselor prompted group members to reference a worksheet called, "Body Scan" to jot down questions and concerns about their physical health in preparation for their upcoming  appointments with medical professionals. Client identified upcoming procedures and maintaince care. Counselor encouraged routine medical check-ups, preparing for appointments, following up with recommendations and seeking specialist if needed.   Check Out: Counselor prompted group members to share what self-care practice or productivity activity they will engage in today. Client plans to work out at the gym and work in his yard. Client endorsed safety plan to be followed to prevent safety issues.   Assessment and Plan: Clinician recommends that Client remain in IOP treatment to better manage mental health symptoms, stabilization and to address treatment plan goals. Clinician recommends adherence to crisis/safety plan, taking medications as prescribed, and following up with medical professionals if any issues arise.   Follow Up Instructions: Clinician will send Webex link for next session. The Client was advised to call back or seek an in-person evaluation if the symptoms worsen or if the condition fails to improve as anticipated.     I provided 180 minutes of non-face-to-face time during this encounter.     Lise Auer, LCSW

## 2021-02-16 ENCOUNTER — Other Ambulatory Visit (HOSPITAL_COMMUNITY): Payer: BC Managed Care – PPO | Admitting: Psychiatry

## 2021-02-16 ENCOUNTER — Other Ambulatory Visit: Payer: Self-pay

## 2021-02-16 DIAGNOSIS — F4321 Adjustment disorder with depressed mood: Secondary | ICD-10-CM | POA: Diagnosis not present

## 2021-02-16 DIAGNOSIS — F4323 Adjustment disorder with mixed anxiety and depressed mood: Secondary | ICD-10-CM

## 2021-02-17 ENCOUNTER — Other Ambulatory Visit: Payer: Self-pay

## 2021-02-17 ENCOUNTER — Encounter (HOSPITAL_COMMUNITY): Payer: Self-pay | Admitting: Psychiatry

## 2021-02-17 ENCOUNTER — Other Ambulatory Visit (HOSPITAL_COMMUNITY): Payer: BC Managed Care – PPO | Admitting: Psychiatry

## 2021-02-17 ENCOUNTER — Encounter (HOSPITAL_COMMUNITY): Payer: Self-pay

## 2021-02-17 DIAGNOSIS — F4323 Adjustment disorder with mixed anxiety and depressed mood: Secondary | ICD-10-CM

## 2021-02-17 DIAGNOSIS — F4321 Adjustment disorder with depressed mood: Secondary | ICD-10-CM | POA: Diagnosis not present

## 2021-02-17 NOTE — Progress Notes (Signed)
Virtual Visit via Video Note  I connected with Isaac Vasquez on 02/17/21 at  9:00 AM EDT by a video enabled telemedicine application and verified that I am speaking with the correct person using two identifiers.  02/16/21 at  9:00 AM EDT by a video enabled telemedicine application and verified that I am speaking with the correct person using two identifiers.  At orientation to the IOP program, Case Managerdiscussed the limitations of evaluation and management by telemedicine and the availability of in person appointments. The patient expressed understanding and agreed to proceed with virtual visits throughout the duration of the program.  Location:  Patient: Patient Home Provider: Home Office  History of Present Illness: Adjustment DO  Observations/Objective: Check In: Case Manager checked in with all participants to review discharge dates, insurance authorizations, work-related documents and needs from the treatment team regarding medications. Client stated needs and engaged in discussion. Case Manager introduced a new Client to the group with the group members beginning the joining process.   Initial Therapeutic Activity: Counselor presented information and discussion on Grief and Loss. Group members engaged in discussion, taking note of losses that impact them today, grief work, philosophies on grief and recovering. Counselor prompted group to spend 10-15 minutes journaling to process personal grief and loss situations. Counselor processed entries with group and client's identified areas for additional processing in individual therapy. Client noted grief related to the loss of his mother and how he supports his dad through the grief.    Second Therapeutic Activity: Counselor facilitated a check-in with group members to assess mood and current functioning. Client shared details of their mental health management since our last session, including challenges and successes. Counselor  engaged group in discussion, covering the following topics: emotional support animals, how anxiety impacts physical health, coping with stress, utilizing individual therapy for preparing for hard conversations, grief and loss support groups, and addressing psychosis. Client presents with moderate depression and moderate anxiety. Client denied any current SI/HI/psychosis. Client has recent history of SI.   Check Out: Counselor prompted group members to share what self-care practice or productivity activity they will engage in today. Client plans to take a nap. Client endorsed safety plan to be followed to prevent safety issues.   Assessment and Plan: Clinician recommends that Client remain in IOP treatment to better manage mental health symptoms, stabilization and to address treatment plan goals. Clinician recommends adherence to crisis/safety plan, taking medications as prescribed, and following up with medical professionals if any issues arise.  Follow Up Instructions: Clinician will send Webex link for next session. The Client was advised to call back or seek an in-person evaluation if the symptoms worsen or if the condition fails to improve as anticipated.   I provided170mnutes of non-face-to-face time during this encounter.   BLise Auer LCSW

## 2021-02-17 NOTE — Progress Notes (Signed)
Virtual Visit via Video Note  I connected with Isaac Vasquez on 02/17/21 at  9:00 AM EDT by a video enabled telemedicine application and verified that I am speaking with the correct person using two identifiers.  At orientation to the IOP program, Case Manager discussed the limitations of evaluation and management by telemedicine and the availability of in person appointments. The patient expressed understanding and agreed to proceed with virtual visits throughout the duration of the program.   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 Client stated needs and engaged in discussion. Case Manager introduced 2 new Clients to the group with the group members beginning the joining process.   Initial Therapeutic Activity: Counselor facilitated a check-in with group members to assess mood and current functioning. Client shared details of their mental health management since our last session, including challenges and successes. Counselor engaged group in discussion, covering the following topics: preparing for mixed emotions of Mother's Day, psychological testing resources, importance of exercise and hydration, impact of MH on relationships, and childhood trauma. Client presents with mild depression and moderate anxiety. Client denied any current SI/HI/psychosis.   Second Therapeutic Activity: Counselor engaged group in the creation of an ECOMap to assess current relationships, energy lines, dynamics and boundaries. Counselor provided instructions on how to create symbols to signify patterns. Counselor challenged group members to take a step back to notice themes and trends, strengths and areas for change to create individualized  goals. Group will present their ECOMaps during the next session.    Check Out: Counselor prompted group members to share what self-care practice or productivity activity they will engage over the weekend. Client plans to spend time with father to support as they grieve the loss of his mother over the holiday weekend. Client endorsed safety plan to be followed to prevent safety issues.   Assessment and Plan: Clinician recommends that Client remain in IOP treatment to better manage mental health symptoms, stabilization and to address treatment plan goals. Clinician recommends adherence to crisis/safety plan, taking medications as prescribed, and following up with medical professionals if any issues arise.   Follow Up Instructions: Clinician will send Webex link for next session. The Client was advised to call back or seek an in-person evaluation if the symptoms worsen or if the condition fails to improve as anticipated.     I provided 180 minutes of non-face-to-face time during this encounter.     Lise Auer, LCSW

## 2021-02-20 ENCOUNTER — Other Ambulatory Visit (HOSPITAL_COMMUNITY): Payer: BC Managed Care – PPO | Admitting: Psychiatry

## 2021-02-20 ENCOUNTER — Other Ambulatory Visit: Payer: Self-pay

## 2021-02-20 ENCOUNTER — Telehealth (HOSPITAL_COMMUNITY): Payer: Self-pay | Admitting: Psychiatry

## 2021-02-21 ENCOUNTER — Telehealth (HOSPITAL_COMMUNITY): Payer: Self-pay | Admitting: Psychiatry

## 2021-02-21 ENCOUNTER — Other Ambulatory Visit: Payer: Self-pay

## 2021-02-21 ENCOUNTER — Other Ambulatory Visit (HOSPITAL_COMMUNITY): Payer: BC Managed Care – PPO | Admitting: Psychiatry

## 2021-02-22 ENCOUNTER — Other Ambulatory Visit (HOSPITAL_COMMUNITY): Payer: BC Managed Care – PPO | Admitting: Psychiatry

## 2021-02-22 ENCOUNTER — Other Ambulatory Visit: Payer: Self-pay

## 2021-02-22 ENCOUNTER — Telehealth (HOSPITAL_COMMUNITY): Payer: Self-pay | Admitting: Psychiatry

## 2021-02-23 ENCOUNTER — Other Ambulatory Visit: Payer: Self-pay

## 2021-02-23 ENCOUNTER — Encounter (HOSPITAL_COMMUNITY): Payer: Self-pay

## 2021-02-23 ENCOUNTER — Other Ambulatory Visit (HOSPITAL_COMMUNITY): Payer: BC Managed Care – PPO | Admitting: Psychiatry

## 2021-02-23 DIAGNOSIS — F4323 Adjustment disorder with mixed anxiety and depressed mood: Secondary | ICD-10-CM

## 2021-02-23 DIAGNOSIS — F4321 Adjustment disorder with depressed mood: Secondary | ICD-10-CM | POA: Diagnosis not present

## 2021-02-23 NOTE — Progress Notes (Signed)
Virtual Visit via Video Note  I connected with Isaac Vasquez on 02/23/21 at  9:00 AM EDT by a video enabled telemedicine application and verified that I am speaking with the correct person using two identifiers.  At orientation to the IOP program, Case Manager discussed the limitations of evaluation and management by telemedicine and the availability of in person appointments. The patient expressed understanding and agreed to proceed with virtual visits throughout the duration of the program.   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 Client stated needs and engaged in discussion. Case Manager introduce 2 new Clients to the group, as group members welcomed and started the joining process.   Initial Therapeutic Activity: Counselor facilitated a check-in with group members to assess mood and current functioning. Client shared details of their mental health management since our last session, including challenges and successes. Counselor engaged group in discussion, covering the following topics: self-esteem, body image/concept, financial stressors, implementing relaxation skills, and stress management. Client presents with mild depression and moderate anxiety. Client denied any current SI/HI/psychosis.   Second Therapeutic Activity: Counselor introduced [employer], representative with [employer] to share about programming. Group Members asked questions and engaged in discussion, as Cristie Hem shared about Peer Support, Support Groups and the [employer]. Client stated that they are interested in connecting with the friends and family support group.   Check Out: Counselor closed program by allowing time to celebrate 2  graduating group members. Counselor shared reflections on progress and allow space for group members to share well wishes and appreciates to the graduating client. Counselor prompted graduating client to share takeaways, reflect on progress and final thoughts for the group. Counselor prompted group members to share what self-care practice or productivity activity they will engage over the weekend. Client plans to nap, exercise and do yard work. Client endorsed safety plan to be followed to prevent safety issues.   Assessment and Plan: Clinician recommends that Client remain in IOP treatment to better manage mental health symptoms, stabilization and to address treatment plan goals. Clinician recommends adherence to crisis/safety plan, taking medications as prescribed, and following up with medical professionals if any issues arise.   Follow Up Instructions: Clinician will send Webex link for next session. The Client was advised to call back or seek an in-person evaluation if the symptoms worsen or if the condition fails to improve as anticipated.     I provided 180 minutes of non-face-to-face time during this encounter.     Lise Auer, LCSW

## 2021-02-24 ENCOUNTER — Other Ambulatory Visit (HOSPITAL_COMMUNITY): Payer: BC Managed Care – PPO | Admitting: Psychiatry

## 2021-02-24 ENCOUNTER — Other Ambulatory Visit: Payer: Self-pay

## 2021-02-24 DIAGNOSIS — F4321 Adjustment disorder with depressed mood: Secondary | ICD-10-CM | POA: Diagnosis not present

## 2021-02-24 DIAGNOSIS — F4323 Adjustment disorder with mixed anxiety and depressed mood: Secondary | ICD-10-CM

## 2021-02-27 ENCOUNTER — Other Ambulatory Visit: Payer: Self-pay

## 2021-02-27 ENCOUNTER — Other Ambulatory Visit (HOSPITAL_COMMUNITY): Payer: BC Managed Care – PPO | Admitting: Psychiatry

## 2021-02-27 ENCOUNTER — Encounter (HOSPITAL_COMMUNITY): Payer: Self-pay

## 2021-02-27 DIAGNOSIS — F4323 Adjustment disorder with mixed anxiety and depressed mood: Secondary | ICD-10-CM

## 2021-02-27 DIAGNOSIS — F4321 Adjustment disorder with depressed mood: Secondary | ICD-10-CM | POA: Diagnosis not present

## 2021-02-27 NOTE — Progress Notes (Signed)
Virtual Visit via Video Note  I connected with Earle Gell II on 02/24/21 at  9:00 AM EDT by a video enabled telemedicine application and verified that I am speaking with the correct person using two identifiers.  At orientation to the IOP program, Case Manager discussed the limitations of evaluation and management by telemedicine and the availability of in person appointments. The patient expressed understanding and agreed to proceed with virtual visits throughout the duration of the program.   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 Client stated needs and engaged in discussion.   Initial Therapeutic Activity: Counselor facilitated a check-in with group members to assess mood and current functioning. Client shared details of their mental health management since our last session, including challenges and successes. Counselor engaged group in discussion, covering the following topics: abandonment, superstitions, following safety plans, incorporating support system in a balanced way, and childhood traumas. Client presents with moderate depression and moderate anxiety. Client denied any current SI/HI/psychosis.   Second Therapeutic Activity: Counselor closed program by allowing time to celebrate a graduating group members. Counselor shared reflections on progress and allow space for group members to share well wishes and appreciates to the graduating client. Counselor prompted graduating client to share takeaways, reflect on progress and final thoughts for the group.   Check Out: Counselor prompted group members to share what self-care practice or productivity activity they will engage over the weekend. Client endorsed safety  plan to be followed to prevent safety issues.   Assessment and Plan: Clinician recommends that Client remain in IOP treatment to better manage mental health symptoms, stabilization and to address treatment plan goals. Clinician recommends adherence to crisis/safety plan, taking medications as prescribed, and following up with medical professionals if any issues arise.   Follow Up Instructions: Clinician will send Webex link for next session. The Client was advised to call back or seek an in-person evaluation if the symptoms worsen or if the condition fails to improve as anticipated.     I provided 180 minutes of non-face-to-face time during this encounter.     Lise Auer, LCSW

## 2021-02-27 NOTE — Progress Notes (Signed)
Virtual Visit via Video Note  I connected with Earle Gell II on 02/27/21 at  9:00 AM EDT by a video enabled telemedicine application and verified that I am speaking with the correct person using two identifiers.  At orientation to the IOP program, Case Manager discussed the limitations of evaluation and management by telemedicine and the availability of in person appointments. The patient expressed understanding and agreed to proceed with virtual visits throughout the duration of the program.   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 Client stated needs and engaged in discussion.   Initial Therapeutic Activity: Counselor facilitated a check-in with group members to assess mood and current functioning. Client shared details of their mental health management since our last session, including challenges and successes. Counselor engaged group in discussion, covering the following topics: stages and tasks of development, healing from trauma, supporting others with MH issues, and healthy coping skills. Client presents with moderate depression and moderate anxiety. Client denied any current SI/HI/psychosis.   Second Therapeutic Activity: Counselor presented information on Cognitive Distortions. Client shared a video outlining 15 styles of cognitive distortions. Group members shared which ones they do most in their thought processes. Client related to all the thought patterns. Counselor shared 5 strategies for combating distorted thinking. Counselor shared links for resources for Group Members to review for homework.  Check Out: Counselor prompted group members to share what self-care practice or productivity activity they will  engage today. Client endorsed safety plan to be followed to prevent safety issues.   Assessment and Plan: Clinician recommends that Client remain in IOP treatment to better manage mental health symptoms, stabilization and to address treatment plan goals. Clinician recommends adherence to crisis/safety plan, taking medications as prescribed, and following up with medical professionals if any issues arise.   Follow Up Instructions: Clinician will send Webex link for next session. The Client was advised to call back or seek an in-person evaluation if the symptoms worsen or if the condition fails to improve as anticipated.     I provided 180 minutes of non-face-to-face time during this encounter.     Lise Auer, LCSW

## 2021-02-28 ENCOUNTER — Other Ambulatory Visit (HOSPITAL_COMMUNITY): Payer: BC Managed Care – PPO | Admitting: Licensed Clinical Social Worker

## 2021-02-28 ENCOUNTER — Other Ambulatory Visit: Payer: Self-pay

## 2021-02-28 DIAGNOSIS — F4321 Adjustment disorder with depressed mood: Secondary | ICD-10-CM | POA: Diagnosis not present

## 2021-02-28 DIAGNOSIS — F4323 Adjustment disorder with mixed anxiety and depressed mood: Secondary | ICD-10-CM

## 2021-03-01 ENCOUNTER — Other Ambulatory Visit (HOSPITAL_COMMUNITY): Payer: BC Managed Care – PPO | Admitting: Licensed Clinical Social Worker

## 2021-03-01 ENCOUNTER — Other Ambulatory Visit: Payer: Self-pay

## 2021-03-01 DIAGNOSIS — F4321 Adjustment disorder with depressed mood: Secondary | ICD-10-CM | POA: Diagnosis not present

## 2021-03-01 DIAGNOSIS — F4323 Adjustment disorder with mixed anxiety and depressed mood: Secondary | ICD-10-CM

## 2021-03-02 ENCOUNTER — Other Ambulatory Visit: Payer: Self-pay

## 2021-03-02 ENCOUNTER — Other Ambulatory Visit (HOSPITAL_COMMUNITY): Payer: BC Managed Care – PPO | Admitting: Psychiatry

## 2021-03-02 DIAGNOSIS — F4323 Adjustment disorder with mixed anxiety and depressed mood: Secondary | ICD-10-CM

## 2021-03-02 DIAGNOSIS — F4321 Adjustment disorder with depressed mood: Secondary | ICD-10-CM | POA: Diagnosis not present

## 2021-03-03 ENCOUNTER — Other Ambulatory Visit (HOSPITAL_COMMUNITY): Payer: BC Managed Care – PPO | Admitting: Psychiatry

## 2021-03-03 ENCOUNTER — Other Ambulatory Visit: Payer: Self-pay

## 2021-03-03 ENCOUNTER — Encounter (HOSPITAL_COMMUNITY): Payer: Self-pay

## 2021-03-03 ENCOUNTER — Encounter (HOSPITAL_COMMUNITY): Payer: Self-pay | Admitting: Psychiatry

## 2021-03-03 DIAGNOSIS — F4323 Adjustment disorder with mixed anxiety and depressed mood: Secondary | ICD-10-CM

## 2021-03-03 DIAGNOSIS — F4321 Adjustment disorder with depressed mood: Secondary | ICD-10-CM | POA: Diagnosis not present

## 2021-03-03 NOTE — Progress Notes (Signed)
Virtual Visit via Video Note  I connected with Isaac Vasquez on 03/03/21 at  9:00 AM EDT by a video enabled telemedicine application and verified that I am speaking with the correct person using two identifiers.  At orientation to the IOP program, Case Manager discussed the limitations of evaluation and management by telemedicine and the availability of in person appointments. The patient expressed understanding and agreed to proceed with virtual visits throughout the duration of the program.   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 Client stated needs and engaged in discussion.   Initial Therapeutic Activity: Counselor facilitated a check-in with group members to assess mood and current functioning. Client shared details of their mental health management since our last session, including challenges and successes. Counselor engaged group in discussion, covering the following topics: depression symptoms, specific treatments for trauma, volunteering, right therapist, right medicines, coping strategies in overcoming addictions, and boundary setting. Client presents with mild depression and moderate anxiety. Client denied any current SI/HI/psychosis.   Second Therapeutic Activity: Counselor provided resources on coping skill application and 3 exhaustive lists of coping skills to utilize in a variety of settings and scenarios. Counselor prompted group members to identify coping skills they can use to better manage mental health symptoms. Counselor provided a therapeutic video by [employer] on the purpose of coping skills to calm the central nervous system in order to be more capable in dealing with stressors directly.  Group engaged in discussion about takeaways and application.   Check Out: Counselor prompted group members to share what self-care practice or productivity activity they will engage in over the weekend. Group members shared their plans with the group. Client endorsed safety plan to be followed to prevent safety issues.   Assessment and Plan: Clinician recommends that Client remain in IOP treatment to better manage mental health symptoms, stabilization and to address treatment plan goals. Clinician recommends adherence to crisis/safety plan, taking medications as prescribed, and following up with medical professionals if any issues arise.    Follow Up Instructions: Clinician will send Webex link for next session. The Client was advised to call back or seek an in-person evaluation if the symptoms worsen or if the condition fails to improve as anticipated.     I provided 180 minutes of non-face-to-face time during this encounter.     Lise Auer, LCSW

## 2021-03-03 NOTE — Progress Notes (Signed)
Virtual Visit via Video Note  I connected with Isaac Vasquez on5/19/22 at  9:00 AM EDT by a video enabled telemedicine application and verified that I am speaking with the correct person using two identifiers.   At orientation to the IOP program, Case Manager discussed the limitations of evaluation and management by telemedicine and the availability of in person appointments. The patient expressed understanding and agreed to proceed with virtual visits throughout the duration of the program.   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 Client stated needs and engaged in discussion.   Initial Therapeutic Activity: Counselor facilitated a check-in with group members to assess mood and current functioning. Client shared details of their mental health management since our last session, including challenges and successes. Counselor engaged group in discussion, covering the following topics: life stressors, securing employment and housing strategies, navigating relationships, COVID survival strategies and chronic pain impacting mental health. Client presents with mild depression and moderate anxiety. Client denied any current SI/HI/psychosis.   Second Therapeutic Activity: Counselor introduced [place], [place] Chaplain to present information and discussion on Grief and Loss. Group members engaged in discussion, sharing how grief impacts them, what comforts them, what emotions are felt, labeling losses, etc. After guest speaker logged off, Counselor prompted group to spend 10-15 minutes journaling to process personal grief and loss situations. Counselor processed entries with group and client's identified areas for additional  processing in individual therapy. Client noted grief related to loss of mom.  Check Out: Counselor closed program by allowing time to celebrate a graduating group member. Counselor shared reflections on progress and allow space for group members to share well wishes and appreciates to the graduating client. Counselor prompted graduating client to share takeaways, reflect on progress and final thoughts for the group. Counselor prompted group members to share what self-care practice or productivity activity they will engage in today. Group members shared their plans with the group. Client endorsed safety plan to be followed to prevent safety issues.   Assessment and Plan: Clinician recommends that Client remain in IOP treatment to better manage mental health symptoms, stabilization and to address treatment plan goals. Clinician recommends adherence to crisis/safety plan, taking medications as prescribed, and following up with medical professionals if any issues arise.    Follow Up Instructions: Clinician will send Webex link for next session. The Client was advised to call back or seek an in-person evaluation if the symptoms worsen or if the condition fails to improve as anticipated.     I provided 180 minutes of non-face-to-face time during this encounter.     Lise Auer, LCSW

## 2021-03-06 ENCOUNTER — Telehealth (HOSPITAL_COMMUNITY): Payer: Self-pay | Admitting: Psychiatry

## 2021-03-06 ENCOUNTER — Other Ambulatory Visit: Payer: Self-pay

## 2021-03-06 ENCOUNTER — Other Ambulatory Visit (HOSPITAL_COMMUNITY): Payer: BC Managed Care – PPO | Admitting: Psychiatry

## 2021-03-07 ENCOUNTER — Other Ambulatory Visit (HOSPITAL_COMMUNITY): Payer: BC Managed Care – PPO

## 2021-03-07 ENCOUNTER — Other Ambulatory Visit: Payer: Self-pay

## 2021-03-08 ENCOUNTER — Telehealth (HOSPITAL_COMMUNITY): Payer: Self-pay | Admitting: Psychiatry

## 2021-03-08 ENCOUNTER — Other Ambulatory Visit (HOSPITAL_COMMUNITY): Payer: BC Managed Care – PPO

## 2021-03-08 ENCOUNTER — Other Ambulatory Visit: Payer: Self-pay

## 2021-03-08 NOTE — Progress Notes (Signed)
  [facility] Discharge Summary  Isaac Vasquez [id number]  Admission date: 02/13/2021 Discharge date: 03/08/2021  Reason for admission: Per admission assessment note: Isaac Vasquez is a 50 year old single African-American male who presents with worsening depression and anxiety related to multiple stressors.  Reports his primary stressors is related to his current employer.  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  Reports he has been employed with [employer] for the past 13 years and recently he has noticed that the company has become skills focused which caused undue stress.  States he feels that his Risk manager him often.  States that he was recently diagnosed with COVID and continues to have set expectation of self/service goals that are unattainable.  Progress in Program Toward Treatment Goals: Ongoing, NP attempted to follow-up with patient for discharge assessment.  Patient attended and participated sporadically.  Patient has multiple missed absences.  Unable to follow progress.  Progress (rationale): Keep follow-up appointment with current outpatient providers.   Take all medications as prescribed. Keep all follow-up appointments as scheduled.  Do not consume alcohol or use illegal drugs while on prescription medications. Report any adverse effects from your medications to your primary care provider promptly.  In the event of recurrent symptoms or worsening symptoms, call 911, a crisis hotline, or go to the nearest emergency department for evaluation.   Ricky Ala, NP  03/08/2021

## 2021-03-09 ENCOUNTER — Other Ambulatory Visit: Payer: Self-pay

## 2021-03-09 ENCOUNTER — Encounter (HOSPITAL_COMMUNITY): Payer: Self-pay

## 2021-03-09 ENCOUNTER — Other Ambulatory Visit (HOSPITAL_COMMUNITY): Payer: BC Managed Care – PPO | Admitting: Psychiatry

## 2021-03-09 NOTE — Patient Instructions (Signed)
D:  Discharged patient yesterday as scheduled.  After attending MH-IOP on 03-03-21; pt never returned nor did he call.  A:  Follow up with [facility] (per pt's choice).  RTW on 03-15-21 without any restrictions.

## 2021-03-09 NOTE — Progress Notes (Signed)
  Virtual Visit via Telephone Note  I connected with Isaac Vasquez on @TODAY @ at  9:00 AM EDT by telephone and verified that I am speaking with the correct person using two identifiers.  Location: Patient: at home Provider: at office   I discussed the limitations, risks, security and privacy concerns of performing an evaluation and management service by telephone and the availability of in person appointments. I also discussed with the patient that there may be a patient responsible charge related to this service. The patient expressed understanding and agreed to proceed.   I discussed the assessment and treatment plan with the patient. The patient was provided an opportunity to ask questions and all were answered. The patient agreed with the plan and demonstrated an understanding of the instructions.   The patient was advised to call back or seek an in-person evaluation if the symptoms worsen or if the condition fails to improve as anticipated.  I provided 10 minutes of non-face-to-face time during this encounter.   Dellia Nims, M.Ed,CNA  Patient ID: Isaac Vasquez, male   DOB: May 27, 1971, 50 y.o.   MRN: [id number] As per previous CCA states:  This is 50 yr old, single, employed, [place] American male, who was a self referral.  Reports increased anxiety and depression for over one yr.  Stressor/Trigger:  1) Job ([employer]) of 13 yrs.  States he had left the job and found another one in his field, but ended up returning in 2015.  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  The metrics are impossible to meet.  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  "As of 12-27-20, I had 196 hrs of vacation time as of today I have none.  That shows how difficult it's been for me b/c I've been taking days.  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  Pt denies any previous inpatient psych stays or outpt treatment.  Denies any prior suicide attempts or gestures.  Denies any family hx.  Reports family as his support system.  He and his girlfriend resides together.  Current Symptoms/Problems: decreased energy, no motivation, decreased self-esteem, poor concentration, irritability, decreased appetite, anhedonia, sadness, tearfulness, isolative, poor sleep, ruminating thoughts, feelings of worthlessness, helplessness and hopelessness, anxious   Pt attended 12 virtual MH-IOP days out of 15 that were authorized.  After attending 03-03-21, pt never returned to group nor did he call the case manager back.  Will go ahead and discharge pt (03-08-21) as scheduled.  A:  Discharged yesterday.  Pt had previously stated that he would like to f/u with [facility].  RTW on 03-15-21; without any restrictions.  Encouraged support groups.    Dellia Nims, M.Ed,CNA

## 2021-03-10 ENCOUNTER — Ambulatory Visit (HOSPITAL_COMMUNITY): Payer: BC Managed Care – PPO

## 2021-03-25 NOTE — Progress Notes (Signed)
Virtual Visit via Video Note  I connected with Isaac Vasquez on 03/01/21 at  9:00 AM EDT by a video enabled telemedicine application and verified that I am speaking with the correct person using two identifiers.  Location: Patient: patient home Provider: clinical home office   At orientation to the IOP program, Case Manager discussed the limitations of evaluation and management by telemedicine and the availability of in person appointments. The patient expressed understanding and agreed to proceed with virtual visits throughout the duration of the program.  History of Present Illness: Adjustment Disorder   Observations/Objective: 9:00 - 10:00: Pharmacy group. Pt engaged in discussion.    10:00 - 11:00: Clinician led check-in regarding current stressors and situation. Clinician utilized active listening and empathetic response and validated patient emotions. Clinician facilitated processing group on pertinent issues. Patient arrived within time allowed and reports that he is feeling "pretty good." Patient rates his mood at a 8 on a scale of 1-10 with 10 being great. Pt able to process. Pt engaged in discussion.     11:00 - 12:00: Wellness group with J. Athas on ways overall wellness can support mental health Pt engaged in discussion.      Check Out: Counselor prompted group members to share what self-care practice or productivity activity they will engage today. Client endorsed safety plan to be followed to prevent safety issues.  Assessment and Plan: Clinician recommends that Client remain in IOP treatment to better manage mental health symptoms, stabilization and to address treatment plan goals. Clinician recommends adherence to crisis/safety plan, taking medications as prescribed, and following up with medical professionals if any issues arise.  Follow Up Instructions: Clinician will send Webex link for next session. The Client was advised to call back or seek an in-person  evaluation if the symptoms worsen or if the condition fails to improve as anticipated.   I provided 180 minutes of non-face-to-face time during this encounter.   Lorin Glass, LCSW

## 2021-03-25 NOTE — Progress Notes (Signed)
Virtual Visit via Video Note  I connected with Isaac Vasquez on 02/28/21 at  9:00 AM EDT by a video enabled telemedicine application and verified that I am speaking with the correct person using two identifiers.  Location: Patient: patient home Provider: clinical home office   At orientation to the IOP program, Case Manager discussed the limitations of evaluation and management by telemedicine and the availability of in person appointments. The patient expressed understanding and agreed to proceed with virtual visits throughout the duration of the program.  History of Present Illness: Adjustment Disorder   Observations/Objective: 9:00 - 10:00: Clinician led check-in regarding current stressors and situation. Clinician utilized active listening and empathetic response and validated patient emotions. Clinician facilitated processing group on pertinent issues. Patient arrived within time allowed and reports that he is feeling "pretty good" Patient rates his mood at a 8.5 on a scale of 1-10 with 10 being great. Pt able to process. Pt engaged in discussion.     10:00 - 11:00: Cln led discussion on deep breathing and its therapeutic benefits, using DBT TIPP skills to inform discussion. Group practiced how to breathe from their diaphragms to ensure therapuetic quality and different ways to keep track of regulating breaths.      11:00 - 12:00: Cln continued topic of CBT cognitive distortions and introduced thought challenging. Group utilized Web designer questions" as a way to introduce challenges and reframe distorted thinking. Group members worked through pt examples to practice challenging distorted thinking.     Check Out: Counselor prompted group members to share what self-care practice or productivity activity they will engage today. Client endorsed safety plan to be followed to prevent safety issues.  Assessment and Plan: Clinician recommends that Client remain in IOP treatment  to better manage mental health symptoms, stabilization and to address treatment plan goals. Clinician recommends adherence to crisis/safety plan, taking medications as prescribed, and following up with medical professionals if any issues arise.  Follow Up Instructions: Clinician will send Webex link for next session. The Client was advised to call back or seek an in-person evaluation if the symptoms worsen or if the condition fails to improve as anticipated.   I provided 180 minutes of non-face-to-face time during this encounter.   Lorin Glass, LCSW

## 2022-02-03 ENCOUNTER — Emergency Department (HOSPITAL_BASED_OUTPATIENT_CLINIC_OR_DEPARTMENT_OTHER)
Admission: EM | Admit: 2022-02-03 | Discharge: 2022-02-03 | Disposition: A | Discharge status: Home / Self Care | Payer: BC Managed Care – PPO | Attending: Emergency Medicine | Admitting: Emergency Medicine

## 2022-02-03 ENCOUNTER — Encounter (HOSPITAL_BASED_OUTPATIENT_CLINIC_OR_DEPARTMENT_OTHER): Payer: Self-pay | Admitting: Emergency Medicine

## 2022-02-03 ENCOUNTER — Other Ambulatory Visit: Payer: Self-pay

## 2022-02-03 DIAGNOSIS — S91052A Open bite, left ankle, initial encounter: Secondary | ICD-10-CM | POA: Diagnosis present

## 2022-02-03 DIAGNOSIS — W540XXA Bitten by dog, initial encounter: Secondary | ICD-10-CM | POA: Insufficient documentation

## 2022-02-03 MED ORDER — AMOXICILLIN-POT CLAVULANATE 875-125 MG PO TABS
1.0000 | ORAL_TABLET | Freq: Once | ORAL | Status: AC
  Administered 2022-02-03: 1 via ORAL
  Filled 2022-02-03: qty 1

## 2022-02-03 MED ORDER — AMOXICILLIN-POT CLAVULANATE 875-125 MG PO TABS: 1.0000 | ORAL_TABLET | Freq: Two times a day (BID) | ORAL | 0 refills | Status: DC

## 2022-02-03 NOTE — ED Provider Notes (Signed)
?[facility] ?Provider Note ? ? ?CSN: [id number] ?Arrival date & time: 02/03/22  1434 ? ?  ? ?History ? ?Chief Complaint  ?Patient presents with  ? Animal Bite  ? ? ?Isaac Vasquez is a 51 y.o. male. ? ?Patient presents emergency department today for evaluation of a dog bite to the posterior left ankle.  Injury occurred about 3 hours ago.  No treatments prior to arrival other than bandaging.  Patient is able to bear weight but states that the area is sore.  No other injuries reported.  Tetanus updated in 2017.  Dog with vaccines up-to-date. ? ? ?  ? ?Home Medications ?Prior to Admission medications   ?Medication Sig Start Date End Date Taking? Authorizing Provider  ?cetirizine (ZYRTEC) 10 MG tablet Take 10 mg by mouth daily.    [provider]  ?Multiple Vitamin (MULTIVITAMIN WITH MINERALS) TABS tablet Take 1 tablet by mouth daily.    [provider]  ?   ? ?Allergies    ?Patient has no known allergies.   ? ?Review of Systems   ?Review of Systems ? ?Physical Exam ?Updated Vital Signs ?BP (!) 141/86 (BP Location: Right Arm)   Pulse 64   Temp 97.9 ?F (36.6 ?C)   Resp 16   SpO2 98%  ?Physical Exam ?Vitals and nursing note reviewed.  ?Constitutional:   ?   Appearance: He is well-developed.  ?HENT:  ?   Head: Normocephalic and atraumatic.  ?Eyes:  ?   Conjunctiva/sclera: Conjunctivae normal.  ?Pulmonary:  ?   Effort: No respiratory distress.  ?Musculoskeletal:  ?   Cervical back: Normal range of motion and neck supple.  ?   Comments: Left ankle: 1 small puncture with minimal oozing noted to the posterior portion of the ankle.  There is minor abrasion, superficial nearby.  No foreign bodies palpated over puncture area.  Wound is only approximately 2 mm in length.  ?Skin: ?   General: Skin is warm and dry.  ?Neurological:  ?   Mental Status: He is alert.  ? ? ?ED Results / Procedures / Treatments   ?Labs ?(all labs ordered are listed, but only abnormal results are  displayed) ?Labs Reviewed - No data to display ? ?EKG ?None ? ?Radiology ?No results found. ? ?Procedures ?Procedures  ? ? ?Medications Ordered in ED ?Medications  ?amoxicillin-clavulanate (AUGMENTIN) 875-125 MG per tablet 1 tablet (has no administration in time range)  ? ? ?ED Course/ Medical Decision Making/ A&P ?  ? ?Patient seen and examined. History obtained directly from patient.  ? ?Labs/EKG: None ordered ? ?Imaging: Considered x-ray however injury superficial and low suspicion for foreign body.  Very low concern for bony injury. ? ?Medications/Fluids: Ordered: Augmentin.  Will need prophylaxis x5 days. ? ?Most recent vital signs reviewed and are as follows: ?BP (!) 141/86 (BP Location: Right Arm)   Pulse 64   Temp 97.9 ?F (36.6 ?C)   Resp 16   SpO2 98%  ? ?Initial impression: Dog bite with small open wound. ? ?Plan: Good wound care, 5 days of prophylaxis.  Tetanus up-to-date.  No indication for rabies vaccine at this point. ? ?Pt urged to return with worsening pain, worsening swelling, expanding area of redness or streaking up extremity, fever, or any other concerns. Urged to take complete course of antibiotics as prescribed. Pt verbalizes understanding and agrees with plan. ? ?                        ?  Medical Decision Making ?Risk ?Prescription drug management. ? ? ?Patient with dog bite to the left posterior ankle.  No concern for bony injury or foreign body.  Wound is minor and appears fairly shallow.  Treatment plan as above. ? ? ? ? ? ? ? ? ? ?Final Clinical Impression(s) / ED Diagnoses ?Final diagnoses:  ?Animal bite of ankle, left, initial encounter  ? ? ?Rx / DC Orders ?ED Discharge Orders   ? ?      Ordered  ?  amoxicillin-clavulanate (AUGMENTIN) 875-125 MG tablet  Every 12 hours       ? 02/03/22 1618  ? ?  ?  ? ?  ? ? ?  ?Carlisle Cater, PA-C ?02/03/22 1639 ? ?  ?Charlesetta Shanks, MD ?02/03/22 1850 ? ?

## 2022-02-03 NOTE — Discharge Instructions (Signed)
Please return with worsening pain, redness, swelling, drainage from the wound or if you have any other concerns.  Please fill and take the antibiotic as directed for the next 5 days. ? ? ?

## 2022-02-03 NOTE — ED Triage Notes (Signed)
Pt bit by dog at approx 1215 today.  Dog history obtained from owners, paperwork with patient.  Bite marked noted to patient's left calf... No active bleeding at present.  ?

## 2022-02-05 ENCOUNTER — Telehealth: Payer: Self-pay | Admitting: Family Medicine

## 2022-02-05 ENCOUNTER — Ambulatory Visit: Payer: Self-pay | Admitting: Family Medicine

## 2022-02-05 NOTE — Telephone Encounter (Signed)
Patient wants to know if he could become a new patient for Dr.Jordan again. 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 Okay to schedule?  ? ? ? ? ? ? ? ?Please advise  ?

## 2022-02-05 NOTE — Progress Notes (Deleted)
    ACUTE VISIT No chief complaint on file.  HPI: [name] is a 51 y.o. male, who is here today complaining of *** Evaluated in the ED on 02/03/2022 for a left ankle animal bite. Dog with vaccines up-to-date. Last Tdap in 2017.  Currently he is on Augmentin 875-125 mg twice daily.  BP mildly elevated during ED visit, 141/86. ***  HPI  Review of Systems Rest see pertinent positives and negatives per HPI.  Current Outpatient Medications on File Prior to Visit  Medication Sig Dispense Refill   amoxicillin-clavulanate (AUGMENTIN) 875-125 MG tablet Take 1 tablet by mouth every 12 (twelve) hours. 10 tablet 0   cetirizine (ZYRTEC) 10 MG tablet Take 10 mg by mouth daily.     Multiple Vitamin (MULTIVITAMIN WITH MINERALS) TABS tablet Take 1 tablet by mouth daily.     No current facility-administered medications on file prior to visit.     Past Medical History:  Diagnosis Date   Anxiety    Appendicitis    Depression    OSA on CPAP    Vertigo    No Known Allergies  Social History   Socioeconomic History   Marital status: Single    Spouse name: Not on file   Number of children: 3   Years of education: Not on file   Highest education level: Not on file  Occupational History   Not on file  Tobacco Use   Smoking status: Never   Smokeless tobacco: Never  Vaping Use   Vaping Use: Never used  Substance and Sexual Activity   Alcohol use: Yes    Comment: OCCASSIONAL   Drug use: No   Sexual activity: Not Currently  Other Topics Concern   Not on file  Social History Narrative   Not on file   Social Determinants of Health   Financial Resource Strain: Not on file  Food Insecurity: Not on file  Transportation Needs: Not on file  Physical Activity: Not on file  Stress: Not on file  Social Connections: Not on file    There were no vitals filed for this visit. There is no height or weight on file to calculate BMI.  Physical Exam  ASSESSMENT AND  PLAN:  There are no diagnoses linked to this encounter.   No follow-ups on file.   Betty G. Martinique, MD  [facility]. [place] office.  Discharge Instructions   None

## 2022-02-12 NOTE — Telephone Encounter (Signed)
It is ok to re-schedule. ?Thanks, ?BJ ?

## 2022-02-13 NOTE — Telephone Encounter (Signed)
Let patient a voicemail to schedule ?

## 2022-11-27 ENCOUNTER — Encounter: Payer: Self-pay | Admitting: Family Medicine

## 2022-12-03 ENCOUNTER — Telehealth: Payer: Self-pay | Admitting: Family Medicine

## 2022-12-03 NOTE — Telephone Encounter (Signed)
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 LVM to sched the appt.   FYI.

## 2022-12-18 ENCOUNTER — Encounter: Payer: Self-pay | Admitting: Urology

## 2023-06-18 ENCOUNTER — Emergency Department (HOSPITAL_COMMUNITY): Payer: Self-pay

## 2023-06-18 ENCOUNTER — Encounter (HOSPITAL_COMMUNITY): Payer: Self-pay | Admitting: Emergency Medicine

## 2023-06-18 ENCOUNTER — Other Ambulatory Visit: Payer: Self-pay

## 2023-06-18 ENCOUNTER — Emergency Department (HOSPITAL_COMMUNITY)
Admission: EM | Admit: 2023-06-18 | Discharge: 2023-06-19 | Disposition: A | Discharge status: Home / Self Care | Payer: Self-pay | Attending: Emergency Medicine | Admitting: Emergency Medicine

## 2023-06-18 DIAGNOSIS — R791 Abnormal coagulation profile: Secondary | ICD-10-CM | POA: Insufficient documentation

## 2023-06-18 DIAGNOSIS — K429 Umbilical hernia without obstruction or gangrene: Secondary | ICD-10-CM | POA: Insufficient documentation

## 2023-06-18 DIAGNOSIS — K439 Ventral hernia without obstruction or gangrene: Secondary | ICD-10-CM | POA: Insufficient documentation

## 2023-06-18 DIAGNOSIS — M79661 Pain in right lower leg: Secondary | ICD-10-CM | POA: Insufficient documentation

## 2023-06-18 DIAGNOSIS — M7989 Other specified soft tissue disorders: Secondary | ICD-10-CM | POA: Insufficient documentation

## 2023-06-18 LAB — COMPREHENSIVE METABOLIC PANEL
ALT: 17 U/L (ref 0–44)
AST: 17 U/L (ref 15–41)
Albumin: 4.1 g/dL (ref 3.5–5.0)
Alkaline Phosphatase: 51 U/L (ref 38–126)
Anion gap: 9 (ref 5–15)
BUN: 10 mg/dL (ref 6–20)
CO2: 23 mmol/L (ref 22–32)
Calcium: 9.1 mg/dL (ref 8.9–10.3)
Chloride: 103 mmol/L (ref 98–111)
Creatinine, Ser: 1.2 mg/dL (ref 0.61–1.24)
GFR, Estimated: >60 mL/min (ref >60)
Glucose, Bld: 91 mg/dL (ref 70–99)
Potassium: 3.6 mmol/L (ref 3.5–5.1)
Sodium: 135 mmol/L (ref 135–145)
Total Bilirubin: 0.8 mg/dL (ref 0.3–1.2)
Total Protein: 8.1 g/dL (ref 6.5–8.1)

## 2023-06-18 LAB — CBC
HCT: 40.3 % (ref 39.0–52.0)
Hemoglobin: 13.1 g/dL (ref 13.0–17.0)
MCH: 29 pg (ref 26.0–34.0)
MCHC: 32.5 g/dL (ref 30.0–36.0)
MCV: 89.4 fL (ref 80.0–100.0)
Platelets: 186 10*3/uL (ref 150–400)
RBC: 4.51 MIL/uL (ref 4.22–5.81)
RDW: 13.1 % (ref 11.5–15.5)
WBC: 5.3 10*3/uL (ref 4.0–10.5)
nRBC: 0 % (ref 0.0–0.2)

## 2023-06-18 LAB — URINALYSIS, ROUTINE W REFLEX MICROSCOPIC
Bilirubin Urine: NEGATIVE
Glucose, UA: NEGATIVE mg/dL
Hgb urine dipstick: NEGATIVE
Ketones, ur: NEGATIVE mg/dL
Leukocytes,Ua: NEGATIVE
Nitrite: NEGATIVE
Protein, ur: NEGATIVE mg/dL
Specific Gravity, Urine: 1.017 (ref 1.005–1.030)
pH: 5 (ref 5.0–8.0)

## 2023-06-18 LAB — D-DIMER, QUANTITATIVE: D-Dimer, Quant: 0.81 ug{FEU}/mL — ABNORMAL HIGH (ref 0.00–0.50)

## 2023-06-18 LAB — LIPASE, BLOOD: Lipase: 27 U/L (ref 11–51)

## 2023-06-18 MED ORDER — IOHEXOL 300 MG/ML  SOLN
100.0000 mL | Freq: Once | INTRAMUSCULAR | Status: AC | PRN
  Administered 2023-06-18: 100 mL via INTRAVENOUS

## 2023-06-18 NOTE — ED Triage Notes (Signed)
Patient coming to ED for evaluation of lower abdominal pain.  Reports he had an appendectomy "nine or ten years ago."  Noticed yesterday that he was beginning to have severe pain and redness around umbilicus.  Pain with movement and states he is unable to bend due to amount of pain.  Redness spreading down to suprapubic area.  No known fevers or nausea. Pt appears uncomfortable.

## 2023-06-18 NOTE — ED Provider Notes (Signed)
[place] EMERGENCY DEPARTMENT AT [facility] Provider Note   CSN: [id number] Arrival date & time: 06/18/23  2004     History  Chief Complaint  Patient presents with   Abdominal Pain    Isaac Vasquez is a 52 y.o. male.  Patient presents to the emergency room complaining of periumbilical abdominal pain which began on Saturday night.  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  He noticed some redness and swelling at the umbilical area.  He states pain is worse with movement and he is unable to bend due to pain.  He denies nausea, vomiting, diarrhea, constipation, fevers, urinary symptoms, chest pain, shortness of breath.  The patient has a history of an appendectomy in April 2012. Patient also endorses right calf tenderness/swelling/pain.  He states he noticed this 2 days ago.  He denies any injury to the area.  The patient does not smoke, has not recently traveled, has no recent surgical history, has no personal cancer history.   Abdominal Pain      Home Medications Prior to Admission medications   Medication Sig Start Date End Date Taking? Authorizing Provider  cetirizine (ZYRTEC) 10 MG tablet Take 10 mg by mouth daily.   Yes [provider]  HYDROcodone-acetaminophen (NORCO/VICODIN) 5-325 MG tablet Take 1 tablet by mouth every 6 (six) hours as needed for up to 5 days. 06/19/23 06/24/23 Yes Darrick Grinder, PA-C  metroNIDAZOLE (FLAGYL) 500 MG tablet Take 1 tablet (500 mg total) by mouth 2 (two) times daily. 06/19/23  Yes Darrick Grinder, PA-C  Multiple Vitamin (MULTIVITAMIN WITH MINERALS) TABS tablet Take 1 tablet by mouth daily.   Yes [provider]  sulfamethoxazole-trimethoprim (BACTRIM DS) 800-160 MG tablet Take 1 tablet by mouth 2 (two) times daily for 3 days. 06/19/23 06/22/23 Yes Darrick Grinder, PA-C  amoxicillin-clavulanate (AUGMENTIN) 875-125 MG tablet Take 1 tablet by mouth every 12 (twelve)  hours. Patient not taking: Reported on 06/18/2023 02/03/22   Renne Crigler, PA-C      Allergies    Patient has no known allergies.    Review of Systems   Review of Systems  Gastrointestinal:  Positive for abdominal pain.    Physical Exam Updated Vital Signs BP 137/82   Pulse 77   Temp 98.3 F (36.8 C) (Oral)   Resp 18   Ht 6\' 2"  (1.88 m)   Wt 100.7 kg   SpO2 96%   BMI 28.50 kg/m  Physical Exam Vitals and nursing note reviewed.  Constitutional:      General: He is not in acute distress.    Appearance: He is well-developed.  HENT:     Head: Normocephalic and atraumatic.  Eyes:     Conjunctiva/sclera: Conjunctivae normal.  Cardiovascular:     Rate and Rhythm: Normal rate and regular rhythm.     Heart sounds: No murmur heard. Pulmonary:     Effort: Pulmonary effort is normal. No respiratory distress.     Breath sounds: Normal breath sounds.  Abdominal:     Palpations: Abdomen is soft.     Tenderness: There is abdominal tenderness.     Hernia: A hernia is present. Hernia is present in the umbilical area.  Musculoskeletal:        General: Tenderness present. No swelling.     Cervical back: Neck supple.     Right lower leg: No edema.     Left lower leg: No edema.  Comments: Right calf tender to palpation, worsened with dorsiflexion.  No significant swelling when compared to contralateral side.  Skin:    General: Skin is warm and dry.     Capillary Refill: Capillary refill takes less than 2 seconds.  Neurological:     Mental Status: He is alert.  Psychiatric:        Mood and Affect: Mood normal.     ED Results / Procedures / Treatments   Labs (all labs ordered are listed, but only abnormal results are displayed) Labs Reviewed  D-DIMER, QUANTITATIVE - Abnormal; Notable for the following components:      Result Value   D-Dimer, Quant 0.81 (*)    All other components within normal limits  LIPASE, BLOOD  COMPREHENSIVE METABOLIC PANEL  CBC  URINALYSIS,  ROUTINE W REFLEX MICROSCOPIC    EKG None  Radiology CT ABDOMEN PELVIS W CONTRAST  Result Date: 06/18/2023 CLINICAL DATA:  Severe pain and redness around the umbilicus. Appendectomy. Hernia suspected. EXAM: CT ABDOMEN AND PELVIS WITH CONTRAST TECHNIQUE: Multidetector CT imaging of the abdomen and pelvis was performed using the standard protocol following bolus administration of intravenous contrast. RADIATION DOSE REDUCTION: This exam was performed according to the departmental dose-optimization program which includes automated exposure control, adjustment of the mA and/or kV according to patient size and/or use of iterative reconstruction technique. CONTRAST:  OMNIPAQUE IOHEXOL 300 MG/ML  SOLN COMPARISON:  CT abdomen and pelvis 01/30/2011 FINDINGS: Lower chest: No acute abnormality. Hepatobiliary: Hepatic steatosis. Normal gallbladder. No biliary dilation. Pancreas: Unremarkable. Spleen: Unremarkable. Adrenals/Urinary Tract: Normal adrenal glands. No urinary calculi or hydronephrosis. Bladder is unremarkable. Stomach/Bowel: Normal caliber large and small bowel. No bowel wall thickening. Appendectomy. Stomach is within normal limits. Vascular/Lymphatic: No significant vascular findings are present. No enlarged abdominal or pelvic lymph nodes. Reproductive: Unremarkable. Other: No free intraperitoneal fluid or air. Fat containing umbilical hernia. Stranding and soft tissue thickening about the umbilical hernia. No organized fluid collection or abscess. No soft tissue gas. Musculoskeletal: No acute fracture. IMPRESSION: 1. Fat containing umbilical hernia. Soft tissue thickening and stranding about the hernia. Correlate for strangulation. No organized fluid collection or abscess. 2. Hepatic steatosis. Electronically Signed   By: Minerva Fester M.D.   On: 06/18/2023 23:35    Procedures Procedures    Medications Ordered in ED Medications  iohexol (OMNIPAQUE) 300 MG/ML solution 100 mL (100 mLs  Intravenous Contrast Given 06/18/23 2301)  fentaNYL (SUBLIMAZE) injection 50 mcg (50 mcg Intravenous Given 06/19/23 0021)    ED Course/ Medical Decision Making/ A&P                                 Medical Decision Making Amount and/or Complexity of Data Reviewed Labs: ordered. Radiology: ordered.  Risk Prescription drug management.   This patient presents to the ED for concern of abdominal pain, this involves an extensive number of treatment options, and is a complaint that carries with it a high risk of complications and morbidity.  The differential diagnosis includes strangulated hernia, incarcerated hernia, colitis, others   Co morbidities that complicate the patient evaluation  History of appendectomy   Lab Tests:  I Ordered, and personally interpreted labs.  The pertinent results include: Unremarkable CMP, CBC, lipase, UA.  Mildly elevated D-dimer at 0.81.   Imaging Studies ordered:  I ordered imaging studies including CT abdomen pelvis with contrast I independently visualized and interpreted imaging which showed  1. Fat  containing umbilical hernia. Soft tissue thickening and  stranding about the hernia. Correlate for strangulation. No  organized fluid collection or abscess.  2. Hepatic steatosis.   I agree with the radiologist interpretation   Consultations Obtained:  I requested consultation with the surgery team,Dr.Blackman,  and discussed lab and imaging findings as well as pertinent plan - they recommend: Antibiotics, pain medication, outpatient follow-up   Problem List / ED Course / Critical interventions / Medication management   I ordered medication including fentanyl for pain Reevaluation of the patient after these medicines showed that the patient improved I have reviewed the patients home medicines and have made adjustments as needed   Social Determinants of Health:  Patient works for the [employer]   Test / Admission - Considered:  The  patient has a hernia with strangulated fat.  Surgery evaluated the CT and recommends outpatient follow-up with antibiotic coverage.  Patient prescribed antibiotics and pain medication. The patient went a right lower extremity swelling and was concerned about possible blood clot.  D-dimer was mildly elevated.  Will have patient follow-up in the morning for imaging to rule out DVT.  Based on patient's lack of risk factors and mild symptoms I do not believe it is necessary to start anticoagulation tonight.  I discussed this with the patient and he is in agreement.  There is no indication at this time for admission.  Discharge home with return precautions.         Final Clinical Impression(s) / ED Diagnoses Final diagnoses:  Hernia of abdominal wall    Rx / DC Orders ED Discharge Orders          Ordered    metroNIDAZOLE (FLAGYL) 500 MG tablet  2 times daily        06/19/23 0120    sulfamethoxazole-trimethoprim (BACTRIM DS) 800-160 MG tablet  2 times daily        06/19/23 0120    LE Venous       Comments: IMPORTANT PATIENT INSTRUCTIONS:  You have been scheduled for an Outpatient Vascular Study at [facility]    If tomorrow is a Saturday, Sunday or holiday, please go to the [facility] Registration Desk at 11 am tomorrow morning and tell them you are there for a vascular study.   If tomorrow is a weekday (Monday-Friday), please go to [facility] Registration at 11 am and tell them you are there for a vascular study.   06/19/23 0121    HYDROcodone-acetaminophen (NORCO/VICODIN) 5-325 MG tablet  Every 6 hours PRN        09 /04/24 0127              Darrick Grinder, PA-C 06/19/23 0127    Gilda Crease, MD 06/19/23 [id number]

## 2023-06-18 NOTE — ED Notes (Signed)
Blood cultures x 1 set, blue top, dark green top, and gray top tubes sent to lab.

## 2023-06-19 ENCOUNTER — Ambulatory Visit (HOSPITAL_COMMUNITY)
Admission: RE | Admit: 2023-06-19 | Discharge: 2023-06-19 | Disposition: A | Discharge status: Home / Self Care | Payer: PRIVATE HEALTH INSURANCE | Source: Ambulatory Visit | Attending: Student | Admitting: Student

## 2023-06-19 DIAGNOSIS — M79661 Pain in right lower leg: Secondary | ICD-10-CM | POA: Insufficient documentation

## 2023-06-19 MED ORDER — SULFAMETHOXAZOLE-TRIMETHOPRIM 800-160 MG PO TABS
1.0000 | ORAL_TABLET | Freq: Two times a day (BID) | ORAL | 0 refills | Status: DC
Start: 2023-06-19 — End: 2023-06-21

## 2023-06-19 MED ORDER — SULFAMETHOXAZOLE-TRIMETHOPRIM 800-160 MG PO TABS
1.0000 | ORAL_TABLET | Freq: Two times a day (BID) | ORAL | 0 refills | Status: DC
Start: 2023-06-19 — End: 2023-06-19

## 2023-06-19 MED ORDER — METRONIDAZOLE 500 MG PO TABS: 500.0000 mg | ORAL_TABLET | Freq: Two times a day (BID) | ORAL | 0 refills | Status: DC

## 2023-06-19 MED ORDER — HYDROCODONE-ACETAMINOPHEN 5-325 MG PO TABS
1.0000 | ORAL_TABLET | Freq: Four times a day (QID) | ORAL | 0 refills | Status: AC | PRN
Start: 2023-06-19 — End: 2023-06-24

## 2023-06-19 MED ORDER — FENTANYL CITRATE PF 50 MCG/ML IJ SOSY
50.0000 ug | PREFILLED_SYRINGE | Freq: Once | INTRAMUSCULAR | Status: AC
  Administered 2023-06-19: 50 ug via INTRAVENOUS
  Filled 2023-06-19: qty 1

## 2023-06-19 NOTE — Discharge Instructions (Addendum)
You were evaluated tonight for abdominal pain and was found to have an umbilical hernia.  I have prescribed antibiotics and pain medication.  Please schedule an appointment with surgery for follow-up.  Please follow instructions to proceed to imaging tomorrow morning for your DVT study. If you develop any life-threatening symptoms such as shortness of breath please return to the emergency department.

## 2023-06-20 ENCOUNTER — Observation Stay (HOSPITAL_COMMUNITY): Payer: BLUE CROSS/BLUE SHIELD | Admitting: Certified Registered Nurse Anesthetist

## 2023-06-20 ENCOUNTER — Other Ambulatory Visit: Payer: Self-pay

## 2023-06-20 ENCOUNTER — Encounter (HOSPITAL_COMMUNITY)
Admission: EM | Disposition: A | Discharge status: Home / Self Care | Payer: Self-pay | Source: Home / Self Care | Attending: Emergency Medicine

## 2023-06-20 ENCOUNTER — Encounter (HOSPITAL_COMMUNITY): Payer: Self-pay

## 2023-06-20 ENCOUNTER — Observation Stay (HOSPITAL_COMMUNITY)
Admission: EM | Admit: 2023-06-20 | Discharge: 2023-06-21 | Disposition: A | Discharge status: Home / Self Care | Payer: BLUE CROSS/BLUE SHIELD | Attending: General Surgery | Admitting: General Surgery

## 2023-06-20 ENCOUNTER — Observation Stay (HOSPITAL_BASED_OUTPATIENT_CLINIC_OR_DEPARTMENT_OTHER): Payer: BLUE CROSS/BLUE SHIELD | Admitting: Certified Registered Nurse Anesthetist

## 2023-06-20 DIAGNOSIS — K420 Umbilical hernia with obstruction, without gangrene: Secondary | ICD-10-CM | POA: Diagnosis present

## 2023-06-20 DIAGNOSIS — K429 Umbilical hernia without obstruction or gangrene: Secondary | ICD-10-CM | POA: Diagnosis present

## 2023-06-20 DIAGNOSIS — L03316 Cellulitis of umbilicus: Secondary | ICD-10-CM | POA: Diagnosis not present

## 2023-06-20 HISTORY — PX: UMBILICAL HERNIA REPAIR: SHX196

## 2023-06-20 LAB — COMPREHENSIVE METABOLIC PANEL
ALT: 14 U/L (ref 0–44)
AST: 16 U/L (ref 15–41)
Albumin: 4 g/dL (ref 3.5–5.0)
Alkaline Phosphatase: 50 U/L (ref 38–126)
Anion gap: 10 (ref 5–15)
BUN: 15 mg/dL (ref 6–20)
CO2: 23 mmol/L (ref 22–32)
Calcium: 8.7 mg/dL — ABNORMAL LOW (ref 8.9–10.3)
Chloride: 105 mmol/L (ref 98–111)
Creatinine, Ser: 1.11 mg/dL (ref 0.61–1.24)
GFR, Estimated: >60 mL/min (ref >60)
Glucose, Bld: 100 mg/dL — ABNORMAL HIGH (ref 70–99)
Potassium: 3.9 mmol/L (ref 3.5–5.1)
Sodium: 138 mmol/L (ref 135–145)
Total Bilirubin: 0.6 mg/dL (ref 0.3–1.2)
Total Protein: 7.4 g/dL (ref 6.5–8.1)

## 2023-06-20 LAB — SURGICAL PCR SCREEN

## 2023-06-20 LAB — CBC WITH DIFFERENTIAL/PLATELET
Abs Immature Granulocytes: 0.02 10*3/uL (ref 0.00–0.07)
Basophils Absolute: 0 10*3/uL (ref 0.0–0.1)
Basophils Relative: 1 %
Eosinophils Absolute: 0.1 10*3/uL (ref 0.0–0.5)
Eosinophils Relative: 2 %
HCT: 39.1 % (ref 39.0–52.0)
Hemoglobin: 12.9 g/dL — ABNORMAL LOW (ref 13.0–17.0)
Immature Granulocytes: 1 %
Lymphocytes Relative: 25 %
Lymphs Abs: 0.8 10*3/uL (ref 0.7–4.0)
MCH: 29.6 pg (ref 26.0–34.0)
MCHC: 33 g/dL (ref 30.0–36.0)
MCV: 89.7 fL (ref 80.0–100.0)
Monocytes Absolute: 0.5 10*3/uL (ref 0.1–1.0)
Monocytes Relative: 15 %
Neutro Abs: 1.9 10*3/uL (ref 1.7–7.7)
Neutrophils Relative %: 56 %
Platelets: 167 10*3/uL (ref 150–400)
RBC: 4.36 MIL/uL (ref 4.22–5.81)
RDW: 12.9 % (ref 11.5–15.5)
WBC: 3.3 10*3/uL — ABNORMAL LOW (ref 4.0–10.5)
nRBC: 0 % (ref 0.0–0.2)

## 2023-06-20 LAB — HIV ANTIBODY (ROUTINE TESTING W REFLEX): HIV Screen 4th Generation wRfx: NONREACTIVE

## 2023-06-20 SURGERY — REPAIR, HERNIA, UMBILICAL, ADULT
Anesthesia: General

## 2023-06-20 MED ORDER — OXYCODONE HCL 5 MG PO TABS: 5.0000 mg | ORAL_TABLET | ORAL | Status: DC | PRN

## 2023-06-20 MED ORDER — ONDANSETRON HCL 4 MG/2ML IJ SOLN
INTRAMUSCULAR | Status: DC | PRN
  Administered 2023-06-20: 4 mg via INTRAVENOUS

## 2023-06-20 MED ORDER — CEFAZOLIN SODIUM-DEXTROSE 2-4 GM/100ML-% IV SOLN
2.0000 g | INTRAVENOUS | Status: AC
  Administered 2023-06-20: 2 g via INTRAVENOUS
  Filled 2023-06-20: qty 100

## 2023-06-20 MED ORDER — MIDAZOLAM HCL 2 MG/2ML IJ SOLN
INTRAMUSCULAR | Status: DC | PRN
  Administered 2023-06-20: 2 mg via INTRAVENOUS

## 2023-06-20 MED ORDER — ACETAMINOPHEN 500 MG PO TABS: 1000.0000 mg | ORAL_TABLET | Freq: Once | ORAL | Status: DC | PRN

## 2023-06-20 MED ORDER — LIDOCAINE 2% (20 MG/ML) 5 ML SYRINGE
INTRAMUSCULAR | Status: DC | PRN
  Administered 2023-06-20: 100 mg via INTRAVENOUS

## 2023-06-20 MED ORDER — FENTANYL CITRATE (PF) 100 MCG/2ML IJ SOLN
INTRAMUSCULAR | Status: AC
  Filled 2023-06-20: qty 2

## 2023-06-20 MED ORDER — LACTATED RINGERS IV SOLN: INTRAVENOUS | Status: DC

## 2023-06-20 MED ORDER — MORPHINE SULFATE (PF) 2 MG/ML IV SOLN
2.0000 mg | INTRAVENOUS | Status: DC | PRN
  Administered 2023-06-20 (×2): 2 mg via INTRAVENOUS
  Filled 2023-06-20 (×2): qty 1

## 2023-06-20 MED ORDER — MORPHINE SULFATE (PF) 2 MG/ML IV SOLN: 2.0000 mg | INTRAVENOUS | Status: DC | PRN

## 2023-06-20 MED ORDER — 0.9 % SODIUM CHLORIDE (POUR BTL) OPTIME
TOPICAL | Status: DC | PRN
  Administered 2023-06-20: 1000 mL

## 2023-06-20 MED ORDER — ACETAMINOPHEN 650 MG RE SUPP: 650.0000 mg | Freq: Four times a day (QID) | RECTAL | Status: DC | PRN

## 2023-06-20 MED ORDER — ENOXAPARIN SODIUM 40 MG/0.4ML IJ SOSY
40.0000 mg | PREFILLED_SYRINGE | INTRAMUSCULAR | Status: DC
  Administered 2023-06-21: 40 mg via SUBCUTANEOUS
  Filled 2023-06-20: qty 0.4

## 2023-06-20 MED ORDER — ONDANSETRON HCL 4 MG/2ML IJ SOLN: 4.0000 mg | Freq: Four times a day (QID) | INTRAMUSCULAR | Status: DC | PRN

## 2023-06-20 MED ORDER — METHOCARBAMOL 1000 MG/10ML IJ SOLN
500.0000 mg | Freq: Three times a day (TID) | INTRAVENOUS | Status: DC | PRN
  Filled 2023-06-20: qty 5

## 2023-06-20 MED ORDER — PROCHLORPERAZINE MALEATE 10 MG PO TABS: 10.0000 mg | ORAL_TABLET | Freq: Four times a day (QID) | ORAL | Status: DC | PRN

## 2023-06-20 MED ORDER — CEFAZOLIN SODIUM-DEXTROSE 2-4 GM/100ML-% IV SOLN: 2.0000 g | Freq: Once | INTRAVENOUS | Status: DC

## 2023-06-20 MED ORDER — CHLORHEXIDINE GLUCONATE 0.12 % MT SOLN
15.0000 mL | Freq: Once | OROMUCOSAL | Status: AC
  Administered 2023-06-20: 15 mL via OROMUCOSAL

## 2023-06-20 MED ORDER — PROCHLORPERAZINE EDISYLATE 10 MG/2ML IJ SOLN: 5.0000 mg | Freq: Four times a day (QID) | INTRAMUSCULAR | Status: DC | PRN

## 2023-06-20 MED ORDER — POLYETHYLENE GLYCOL 3350 17 G PO PACK: 17.0000 g | PACK | Freq: Every day | ORAL | Status: DC | PRN

## 2023-06-20 MED ORDER — OXYCODONE HCL 5 MG PO TABS
5.0000 mg | ORAL_TABLET | ORAL | Status: DC | PRN
  Filled 2023-06-20: qty 2

## 2023-06-20 MED ORDER — FENTANYL CITRATE PF 50 MCG/ML IJ SOSY: 25.0000 ug | PREFILLED_SYRINGE | INTRAMUSCULAR | Status: DC | PRN

## 2023-06-20 MED ORDER — MIDAZOLAM HCL 2 MG/2ML IJ SOLN
INTRAMUSCULAR | Status: AC
  Filled 2023-06-20: qty 2

## 2023-06-20 MED ORDER — DIPHENHYDRAMINE HCL 50 MG/ML IJ SOLN: 25.0000 mg | Freq: Four times a day (QID) | INTRAMUSCULAR | Status: DC | PRN

## 2023-06-20 MED ORDER — OXYCODONE HCL 5 MG PO TABS: 5.0000 mg | ORAL_TABLET | Freq: Once | ORAL | Status: DC | PRN

## 2023-06-20 MED ORDER — ACETAMINOPHEN 10 MG/ML IV SOLN: 1000.0000 mg | Freq: Once | INTRAVENOUS | Status: DC | PRN

## 2023-06-20 MED ORDER — DEXAMETHASONE SODIUM PHOSPHATE 10 MG/ML IJ SOLN
INTRAMUSCULAR | Status: DC | PRN
  Administered 2023-06-20: 10 mg via INTRAVENOUS

## 2023-06-20 MED ORDER — ACETAMINOPHEN 160 MG/5ML PO SOLN: 1000.0000 mg | Freq: Once | ORAL | Status: DC | PRN

## 2023-06-20 MED ORDER — BUPIVACAINE-EPINEPHRINE 0.25% -1:200000 IJ SOLN
INTRAMUSCULAR | Status: DC | PRN
  Administered 2023-06-20: 20 mL

## 2023-06-20 MED ORDER — METHOCARBAMOL 500 MG PO TABS
500.0000 mg | ORAL_TABLET | Freq: Three times a day (TID) | ORAL | Status: DC | PRN
  Administered 2023-06-20: 500 mg via ORAL
  Filled 2023-06-20: qty 1

## 2023-06-20 MED ORDER — BUPIVACAINE LIPOSOME 1.3 % IJ SUSP
INTRAMUSCULAR | Status: DC | PRN
  Administered 2023-06-20: 20 mL

## 2023-06-20 MED ORDER — PROPOFOL 10 MG/ML IV BOLUS
INTRAVENOUS | Status: DC | PRN
  Administered 2023-06-20: 200 mg via INTRAVENOUS

## 2023-06-20 MED ORDER — OXYCODONE HCL 5 MG/5ML PO SOLN: 5.0000 mg | Freq: Once | ORAL | Status: DC | PRN

## 2023-06-20 MED ORDER — ONDANSETRON 4 MG PO TBDP: 4.0000 mg | ORAL_TABLET | Freq: Four times a day (QID) | ORAL | Status: DC | PRN

## 2023-06-20 MED ORDER — MORPHINE SULFATE (PF) 4 MG/ML IV SOLN
4.0000 mg | Freq: Once | INTRAVENOUS | Status: AC
  Administered 2023-06-20: 4 mg via INTRAVENOUS
  Filled 2023-06-20: qty 1

## 2023-06-20 MED ORDER — ACETAMINOPHEN 325 MG PO TABS: 650.0000 mg | ORAL_TABLET | Freq: Four times a day (QID) | ORAL | Status: DC | PRN

## 2023-06-20 MED ORDER — CEPHALEXIN 500 MG PO CAPS
500.0000 mg | ORAL_CAPSULE | Freq: Four times a day (QID) | ORAL | Status: DC
  Administered 2023-06-20 – 2023-06-21 (×3): 500 mg via ORAL
  Filled 2023-06-20 (×3): qty 1

## 2023-06-20 MED ORDER — DIPHENHYDRAMINE HCL 25 MG PO CAPS: 25.0000 mg | ORAL_CAPSULE | Freq: Four times a day (QID) | ORAL | Status: DC | PRN

## 2023-06-20 MED ORDER — SIMETHICONE 80 MG PO CHEW
40.0000 mg | CHEWABLE_TABLET | Freq: Four times a day (QID) | ORAL | Status: DC | PRN
  Filled 2023-06-20: qty 1

## 2023-06-20 MED ORDER — BUPIVACAINE LIPOSOME 1.3 % IJ SUSP
INTRAMUSCULAR | Status: AC
  Filled 2023-06-20: qty 20

## 2023-06-20 MED ORDER — BUPIVACAINE-EPINEPHRINE 0.25% -1:200000 IJ SOLN
INTRAMUSCULAR | Status: AC
  Filled 2023-06-20: qty 1

## 2023-06-20 MED ORDER — FENTANYL CITRATE (PF) 100 MCG/2ML IJ SOLN
INTRAMUSCULAR | Status: DC | PRN
  Administered 2023-06-20: 25 ug via INTRAVENOUS
  Administered 2023-06-20 (×2): 50 ug via INTRAVENOUS
  Administered 2023-06-20: 25 ug via INTRAVENOUS
  Administered 2023-06-20: 50 ug via INTRAVENOUS

## 2023-06-20 MED ORDER — METOPROLOL TARTRATE 5 MG/5ML IV SOLN: 5.0000 mg | Freq: Four times a day (QID) | INTRAVENOUS | Status: DC | PRN

## 2023-06-20 SURGICAL SUPPLY — 36 items
ADH SKN CLS APL DERMABOND .7 (GAUZE/BANDAGES/DRESSINGS) ×1
APL PRP STRL LF DISP 70% ISPRP (MISCELLANEOUS) ×1
BAG COUNTER SPONGE SURGICOUNT (BAG) IMPLANT
BAG SPNG CNTER NS LX DISP (BAG)
BLADE SURG 15 STRL LF DISP TIS (BLADE) ×2 IMPLANT
BLADE SURG 15 STRL SS (BLADE) ×1
BLADE SURG SZ10 CARB STEEL (BLADE) IMPLANT
CHLORAPREP W/TINT 26 (MISCELLANEOUS) ×2 IMPLANT
COVER SURGICAL LIGHT HANDLE (MISCELLANEOUS) ×2 IMPLANT
DERMABOND ADVANCED .7 DNX12 (GAUZE/BANDAGES/DRESSINGS) ×2 IMPLANT
DRAPE LAPAROSCOPIC ABDOMINAL (DRAPES) ×2 IMPLANT
DRAPE UTILITY XL STRL (DRAPES) ×2 IMPLANT
ELECT PENCIL ROCKER SW 15FT (MISCELLANEOUS) ×2 IMPLANT
ELECT REM PT RETURN 15FT ADLT (MISCELLANEOUS) ×2 IMPLANT
GAUZE 4X4 16PLY ~~LOC~~+RFID DBL (SPONGE) IMPLANT
GLOVE BIO SURGEON STRL SZ7 (GLOVE) ×2 IMPLANT
GLOVE INDICATOR 7.5 STRL GRN (GLOVE) ×2 IMPLANT
GOWN STRL REUS W/ TWL XL LVL3 (GOWN DISPOSABLE) ×2 IMPLANT
GOWN STRL REUS W/TWL XL LVL3 (GOWN DISPOSABLE) ×1
KIT BASIN OR (CUSTOM PROCEDURE TRAY) ×2 IMPLANT
KIT TURNOVER KIT A (KITS) IMPLANT
MARKER SKIN DUAL TIP RULER LAB (MISCELLANEOUS) ×2 IMPLANT
NDL HYPO 25X1 1.5 SAFETY (NEEDLE) ×2 IMPLANT
NEEDLE HYPO 25X1 1.5 SAFETY (NEEDLE) ×1 IMPLANT
PACK BASIC VI WITH GOWN DISP (CUSTOM PROCEDURE TRAY) ×2 IMPLANT
SPIKE FLUID TRANSFER (MISCELLANEOUS) ×2 IMPLANT
SPONGE T-LAP 18X18 ~~LOC~~+RFID (SPONGE) ×2 IMPLANT
SUT MNCRL AB 4-0 PS2 18 (SUTURE) ×2 IMPLANT
SUT NOVA NAB DX-16 0-1 5-0 T12 (SUTURE) IMPLANT
SUT PDS AB 1 CT1 27 (SUTURE) IMPLANT
SUT VIC AB 3-0 SH 18 (SUTURE) ×2 IMPLANT
SYR 20ML LL LF (SYRINGE) ×2 IMPLANT
SYR BULB IRRIG 60ML STRL (SYRINGE) IMPLANT
TOWEL OR 17X26 10 PK STRL BLUE (TOWEL DISPOSABLE) ×2 IMPLANT
TOWEL OR NON WOVEN STRL DISP B (DISPOSABLE) ×2 IMPLANT
YANKAUER SUCT BULB TIP 10FT TU (MISCELLANEOUS) ×2 IMPLANT

## 2023-06-20 NOTE — Transfer of Care (Signed)
Immediate Anesthesia Transfer of Care Note  Patient: Isaac Vasquez  Procedure(s) Performed: HERNIA REPAIR UMBILICAL ADULT  Patient Location: PACU  Anesthesia Type:General  Level of Consciousness: awake and alert   Airway & Oxygen Therapy: Patient Spontanous Breathing and Patient connected to face mask oxygen  Post-op Assessment: Report given to RN and Post -op Vital signs reviewed and stable  Post vital signs: Reviewed and stable  Last Vitals:  Vitals Value Taken Time  BP 153/94 06/20/23 1638  Temp    Pulse 69 06/20/23 1640  Resp 13 06/20/23 1640  SpO2 100 % 06/20/23 1640  Vitals shown include unfiled device data.  Last Pain:  Vitals:   06/20/23 1434  TempSrc:   PainSc: 0-No pain         Complications: No notable events documented.

## 2023-06-20 NOTE — Progress Notes (Signed)
[facility] Pre-op Review  Pre-op checklist:  Will request floor RN to complete  NPO:  Currently NPO  Labs:  WNL  Consent:  Order in EPIC  H&P:  Not completed  Vitals:  Stable  O2 requirements:  Room Air   MAR/PTA review:  Reviewed  IV:  20 Right FA  Floor nurse name:  ED RN Esmond Harps  Additional info:  Bed ready: Patient has been assigned to 1301 Surgery may be postponed until tomorrow.

## 2023-06-20 NOTE — ED Triage Notes (Signed)
Patient has an umbilical hernia. Came yesterday and they were messing with it. Today woke up and there is bloody drainage from the area. Painful to touch.

## 2023-06-20 NOTE — ED Provider Notes (Signed)
[place] EMERGENCY DEPARTMENT AT [facility] Provider Note   CSN: [id number] Arrival date & time: 06/20/23  4098     History  Chief Complaint  Patient presents with   Hernia    Isaac Vasquez is a 52 y.o. male w/ pmhx of HLD presenting with an umbilical hernia.  Patient was seen yesterday and had CT scan showing fat containing umbilical hernia and recommended correlation for possible strangulated hernia.  Patient was given 1 dose of antibiotics in the emergency room but has not been able to pick up the antibiotic.  Patient returned because he woke up and there is blood draining from the area which is now very painful to the touch.  Patient reports he has not eaten anything today.  No change in his symptoms including no fevers, chills, change in BM or NV. Pmhx of appey.   HPI     Home Medications Prior to Admission medications   Medication Sig Start Date End Date Taking? Authorizing Provider  amoxicillin-clavulanate (AUGMENTIN) 875-125 MG tablet Take 1 tablet by mouth every 12 (twelve) hours. Patient not taking: Reported on 06/18/2023 02/03/22   Renne Crigler, PA-C  cetirizine (ZYRTEC) 10 MG tablet Take 10 mg by mouth daily.    [provider]  HYDROcodone-acetaminophen (NORCO/VICODIN) 5-325 MG tablet Take 1 tablet by mouth every 6 (six) hours as needed for up to 5 days. 06/19/23 06/24/23  Darrick Grinder, PA-C  metroNIDAZOLE (FLAGYL) 500 MG tablet Take 1 tablet (500 mg total) by mouth 2 (two) times daily. 06/19/23   Darrick Grinder, PA-C  Multiple Vitamin (MULTIVITAMIN WITH MINERALS) TABS tablet Take 1 tablet by mouth daily.    [provider]  sulfamethoxazole-trimethoprim (BACTRIM DS) 800-160 MG tablet Take 1 tablet by mouth 2 (two) times daily for 3 days. 06/19/23 06/22/23  Darrick Grinder, PA-C      Allergies    Patient has no known allergies.    Review of Systems   Review of Systems  Skin:  Positive for wound.    Physical Exam Updated Vital  Signs BP (!) 164/88   Pulse 66   Temp 97.8 F (36.6 C) (Oral)   Resp 17   Ht 6\' 2"  (1.88 m)   Wt 100 kg   SpO2 100%   BMI 28.31 kg/m  Physical Exam Vitals and nursing note reviewed.  Constitutional:      General: He is not in acute distress.    Appearance: He is not toxic-appearing.  HENT:     Head: Normocephalic and atraumatic.  Eyes:     General: No scleral icterus.    Conjunctiva/sclera: Conjunctivae normal.  Cardiovascular:     Rate and Rhythm: Normal rate and regular rhythm.     Pulses: Normal pulses.     Heart sounds: Normal heart sounds.  Pulmonary:     Effort: Pulmonary effort is normal. No respiratory distress.     Breath sounds: Normal breath sounds.  Abdominal:     General: Abdomen is flat. Bowel sounds are normal.     Palpations: Abdomen is soft.     Tenderness: There is abdominal tenderness.     Hernia: A hernia is present.     Comments: See photo of abdomen   Skin:    General: Skin is warm and dry.     Findings: No lesion.  Neurological:     General: No focal deficit present.     Mental Status: He is alert and oriented to person,  place, and time. Mental status is at baseline.      ED Results / Procedures / Treatments   Labs (all labs ordered are listed, but only abnormal results are displayed) Labs Reviewed - No data to display  EKG None  Radiology   Procedures Procedures    Medications Ordered in ED Medications - No data to display  ED Course/ Medical Decision Making/ A&P                                 Medical Decision Making Amount and/or Complexity of Data Reviewed Labs: ordered.  Risk Prescription drug management. Decision regarding hospitalization.   Isaac Vasquez 52 y.o. presented today for abd pain. Working DDx includes, but not limited to, gastroenteritis, colitis, SBO, appendicitis, cholecystitis, hepatobiliary pathology, gastritis, PUD, ACS, dissection, pancreatitis, nephrolithiasis, AAA, UTI,  pyelonephritis  R/o DDx: These are considered less likely than current impression due to history of present illness, physical exam, labs/imaging findings.  Review of prior external notes: yesterdays ED visit, labs and imaging   Pmhx:   Unique Tests and My Interpretation:  CBC: wc 3.3, hgb 12.9  CMP: unremarkable    Imaging: CT abd and pelvis done yesterday -- possible fat containing umbilical hernia and possible strangulation  Spoke with gen surg who did not recommend repeat imaging, they will reorder if they need    Problem List / ED Course / Critical interventions / Medication management  Patient reporting today for pain and drainage from fat-containing umbilical hernia.  Patient was seen yesterday in the emergency room and had CT scan which showed fat-containing umbilical hernia with soft tissue thickening and stranding around the hernia concern for possible strangulation.  Patient's symptoms have not changed other than the hernia now draining.  He reports pain no fevers chills. Will repeat labs and contact general surgery with possible imaging, as recommended. Concern for incarcerated or strangulated hernia.  I ordered medication including Morphine  for pain  Reevaluation of the patient after these medicines showed that the patient stayed the same Patients vitals assessed. Upon arrival patient is hemodynamically stable.  I have reviewed the patients home medicines and have made adjustments as needed   Consult: Gen surg who recommended -- they will come down and see patient today and then confirm plan.   Plan: General surgery will admit patient and probable surgery.          Final Clinical Impression(s) / ED Diagnoses Final diagnoses:  None    Rx / DC Orders ED Discharge Orders     None         Smitty Knudsen, PA-C 06/20/23 1248    Loetta Rough, MD 06/20/23 1407

## 2023-06-20 NOTE — Anesthesia Procedure Notes (Signed)
Procedure Name: LMA Insertion Date/Time: 06/20/2023 3:09 PM  Performed by: [place], Clydene Pugh, CRNAPre-anesthesia Checklist: Patient identified, Emergency Drugs available, Suction available and Patient being monitored Patient Re-evaluated:Patient Re-evaluated prior to induction Oxygen Delivery Method: Circle system utilized Preoxygenation: Pre-oxygenation with 100% oxygen Induction Type: IV induction Ventilation: Mask ventilation without difficulty LMA: LMA inserted LMA Size: 4.0 Number of attempts: 1 Airway Equipment and Method: Bite block Placement Confirmation: positive ETCO2 Tube secured with: Tape Dental Injury: Teeth and Oropharynx as per pre-operative assessment

## 2023-06-20 NOTE — Anesthesia Preprocedure Evaluation (Signed)
Anesthesia Evaluation  Patient identified by MRN, date of birth, ID band Patient awake    Reviewed: Allergy & Precautions, NPO status , Patient's Chart, lab work & pertinent test results  History of Anesthesia Complications Negative for: history of anesthetic complications  Airway Mallampati: III  TM Distance: >3 FB Neck ROM: Full    Dental  (+) Teeth Intact, Dental Advisory Given   Pulmonary neg shortness of breath, sleep apnea and Continuous Positive Airway Pressure Ventilation , neg COPD, neg recent URI   breath sounds clear to auscultation       Cardiovascular negative cardio ROS  Rhythm:Regular     Neuro/Psych  PSYCHIATRIC DISORDERS Anxiety     negative neurological ROS     GI/Hepatic Neg liver ROS,,,Umbilical hernia   Endo/Other  negative endocrine ROS    Renal/GU negative Renal ROSLab Results      Component                Value               Date                      NA                       138                 06/20/2023                K                        3.9                 06/20/2023                CO2                      23                  06/20/2023                GLUCOSE                  100 (H)             06/20/2023                BUN                      15                  06/20/2023                CREATININE               1.11                06/20/2023                CALCIUM                  8.7 (L)             06/20/2023                GFR                      80.44  04/22/2017                GFRNONAA                 >60                 06/20/2023                Musculoskeletal negative musculoskeletal ROS (+)    Abdominal   Peds  Hematology negative hematology ROS (+) Lab Results      Component                Value               Date                      WBC                      3.3 (L)             06/20/2023                HGB                      12.9 (L)             06/20/2023                HCT                      39.1                06/20/2023                MCV                      89.7                06/20/2023                PLT                      167                 06/20/2023              Anesthesia Other Findings   Reproductive/Obstetrics                              Anesthesia Physical Anesthesia Plan  ASA: 2  Anesthesia Plan: General   Post-op Pain Management: Ofirmev IV (intra-op)* and Toradol IV (intra-op)*   Induction: Intravenous  PONV Risk Score and Plan: 2 and Ondansetron, Dexamethasone and Midazolam  Airway Management Planned: Oral ETT  Additional Equipment: None  Intra-op Plan:   Post-operative Plan: Extubation in OR  Informed Consent: I have reviewed the patients History and Physical, chart, labs and discussed the procedure including the risks, benefits and alternatives for the proposed anesthesia with the patient or authorized representative who has indicated his/her understanding and acceptance.     Dental advisory given  Plan Discussed with: CRNA  Anesthesia Plan Comments:          Anesthesia Quick Evaluation

## 2023-06-20 NOTE — Op Note (Signed)
Post-Op Note/Post-Procedure Note  Patient: Isaac Vasquez MRN: [id number] DOB: June 24, 1971 Sex: male Operation/Procedure Date: 06/20/2023 Surgeons and Role:    * Hillery Hunter Lucilla Edin, MD - Primary  Pre-operative Diagnoses: STRANGULATED FAT Postoperative Diagnoses: STRANGULATED FAT, umbilical hernia (1cm x 1cm)  Procedures:   * HERNIA REPAIR UMBILICAL ADULT  Anesthesia: General endotracheal anesthesia Indications: [name] is a 52 y.o. year old male with a history of an appendectomy who presented to the ED with umbilical pain. Based on imaging and physical exam, there was concern for an umbilical hernia with strangulated fat.  Preoperatively, I discussed in detail the risks, benefits, alternatives, and potential complications of surgery. The patient understands and requests to proceed.  Operative Findings: Small fascial defect adjacent to the umbilicus.  There was an opening of the skin inferior to the umbilicus related to a mass of inflammatory tissue.  Technique: The patient was positively identified and was taken to the operating room and placed supine on the operating table. A time-out was performed confirming correct patient and procedure. We also confirmed initiation of deep venous thrombosis prophylaxis and wound prophylaxis. After successful induction of general endotracheal anesthesia, the arms were carefully padded.   The abdomen was prepped and draped in the usual sterile surgical fashion.  There was a draining wound inferior to the area of his umbilicus that was associated with an inflammatory mass of tissue. The wound was incised with a #15 blade and the incision carried down to the level of the fascia using electrocautery.  A small fascial defect measuring less than 1-cm was identified. There was fat bulging from the defect without evidence of strangulation and/or bowel involvement.  The defect was repaired primarily using 3 interrupted #1 novfil suture.  The  inflammatory mass was excised to create a healthy wound for closure. The wound was closed in several layers using interrupted 3-0 vicryl to approximate the soft tissue and 4-0 running monocryl to close the dermis.  Dermabond was applied.  The patient tolerated the procedure well and was extubated and taken to recovery room in good condition. All sponge, needle, and instrument counts were reported correct at the end of the case.  Estimated Blood Loss: Minimal. Specimens:None Implants: * No implants in log * Drains: None. Complications: * No complications entered in OR log * Condition of the patient: Good, extubated Disposition: PACU

## 2023-06-20 NOTE — ED Notes (Signed)
ED TO INPATIENT HANDOFF REPORT  Name/Age/Gender Isaac Vasquez 52 y.o. male  Code Status    Code Status Orders  (From admission, onward)           Start     Ordered   06/20/23 1211  Full code  Continuous       Question:  By:  Answer:  Consent: discussion documented in EHR   06/20/23 1214           Code Status History     This patient has a current code status but no historical code status.       Home/SNF/Other Home  Chief Complaint Umbilical hernia [K42.9]  Level of Care/Admitting Diagnosis ED Disposition     ED Disposition  Admit   Condition  --   Comment  Hospital Area: [facility] [[id number]]  Level of Care: Med-Surg [16]  May place patient in observation at [facility] or [facility] if equivalent level of care is available:: No  Covid Evaluation: Asymptomatic - no recent exposure (last 10 days) testing not required  Diagnosis: Umbilical hernia [742595]  Admitting Physician: CCS, MD [[id number]]  Attending Physician: CCS, MD [[id number]]          Medical History Past Medical History:  Diagnosis Date   Anxiety    Appendicitis    Depression    OSA on CPAP    Vertigo     Allergies No Known Allergies  IV Location/Drains/Wounds Patient Lines/Drains/Airways Status     Active Line/Drains/Airways     Name Placement date Placement time Site Days   Peripheral IV 06/20/23 20 G 1" Anterior;Right Forearm 06/20/23  1029  Forearm  less than 1            Labs/Imaging Results for orders placed or performed during the hospital encounter of 06/20/23 (from the past 48 hour(s))  CBC with Differential     Status: Abnormal   Collection Time: 06/20/23 10:30 AM  Result Value Ref Range   WBC 3.3 (L) 4.0 - 10.5 K/uL   RBC 4.36 4.22 - 5.81 MIL/uL   Hemoglobin 12.9 (L) 13.0 - 17.0 g/dL   HCT 63.8 75.6 - 43.3 %   MCV 89.7 80.0 - 100.0 fL   MCH 29.6 26.0 - 34.0 pg   MCHC 33.0 30.0 - 36.0 g/dL   RDW 29.5 18.8 - 41.6 %   Platelets 167  150 - 400 K/uL   nRBC 0.0 0.0 - 0.2 %   Neutrophils Relative % 56 %   Neutro Abs 1.9 1.7 - 7.7 K/uL   Lymphocytes Relative 25 %   Lymphs Abs 0.8 0.7 - 4.0 K/uL   Monocytes Relative 15 %   Monocytes Absolute 0.5 0.1 - 1.0 K/uL   Eosinophils Relative 2 %   Eosinophils Absolute 0.1 0.0 - 0.5 K/uL   Basophils Relative 1 %   Basophils Absolute 0.0 0.0 - 0.1 K/uL   Immature Granulocytes 1 %   Abs Immature Granulocytes 0.02 0.00 - 0.07 K/uL    Comment: Performed at [facility], [address] [place], [place] [address]  Comprehensive metabolic panel     Status: Abnormal   Collection Time: 06/20/23 10:30 AM  Result Value Ref Range   Sodium 138 135 - 145 mmol/L   Potassium 3.9 3.5 - 5.1 mmol/L   Chloride 105 98 - 111 mmol/L   CO2 23 22 - 32 mmol/L   Glucose, Bld 100 (H) 70 - 99 mg/dL  Comment: Glucose reference range applies only to samples taken after fasting for at least 8 hours.   BUN 15 6 - 20 mg/dL   Creatinine, Ser 3.32 0.61 - 1.24 mg/dL   Calcium 8.7 (L) 8.9 - 10.3 mg/dL   Total Protein 7.4 6.5 - 8.1 g/dL   Albumin 4.0 3.5 - 5.0 g/dL   AST 16 15 - 41 U/L   ALT 14 0 - 44 U/L   Alkaline Phosphatase 50 38 - 126 U/L   Total Bilirubin 0.6 0.3 - 1.2 mg/dL   GFR, Estimated >95 >18 mL/min    Comment: (NOTE) Calculated using the CKD-EPI Creatinine Equation (2021)    Anion gap 10 5 - 15    Comment: Performed at [facility], [address], [place], [place] [address]   LE Venous  Result Date: 06/19/2023  Lower Venous DVT Study Patient Name:  Isaac Vasquez  Date of Exam:   06/19/2023 Medical Rec #: [id number]            Accession #:    [id number] Date of Birth: March 17, 1971            Patient Gender: M Patient Age:   63 years Exam Location:  [facility] Procedure:      VAS [place] LOWER EXTREMITY VENOUS (DVT) Referring Phys: Barrie Dunker --------------------------------------------------------------------------------  Indications: Pain in  right calf.  Risk Factors: Surgery Appendectomy 9-10 years ago. Comparison Study: No priors. Performing Technologist: Marilynne Halsted RDMS, RVT  Examination Guidelines: A complete evaluation includes B-mode imaging, spectral Doppler, color Doppler, and power Doppler as needed of all accessible portions of each vessel. Bilateral testing is considered an integral part of a complete examination. Limited examinations for reoccurring indications may be performed as noted. The reflux portion of the exam is performed with the patient in reverse Trendelenburg.  +---------+---------------+---------+-----------+----------+--------------+ RIGHT    CompressibilityPhasicitySpontaneityPropertiesThrombus Aging +---------+---------------+---------+-----------+----------+--------------+ CFV      Full           Yes      Yes                                 +---------+---------------+---------+-----------+----------+--------------+ SFJ      Full                                                        +---------+---------------+---------+-----------+----------+--------------+ FV Prox  Full                                                        +---------+---------------+---------+-----------+----------+--------------+ FV Mid   Full                                                        +---------+---------------+---------+-----------+----------+--------------+ FV DistalFull                                                        +---------+---------------+---------+-----------+----------+--------------+  PFV      Full                                                        +---------+---------------+---------+-----------+----------+--------------+ POP      Full           Yes      Yes                                 +---------+---------------+---------+-----------+----------+--------------+ PTV      Full                                                         +---------+---------------+---------+-----------+----------+--------------+ PERO     Full                                                        +---------+---------------+---------+-----------+----------+--------------+   +----+---------------+---------+-----------+----------+--------------+ LEFTCompressibilityPhasicitySpontaneityPropertiesThrombus Aging +----+---------------+---------+-----------+----------+--------------+ CFV Full           Yes      Yes                                 +----+---------------+---------+-----------+----------+--------------+ SFJ Full                                                        +----+---------------+---------+-----------+----------+--------------+    Summary: RIGHT: - There is no evidence of deep vein thrombosis in the lower extremity.  - No cystic structure found in the popliteal fossa.  LEFT: - No evidence of common femoral vein obstruction.   *See table(s) above for measurements and observations. Electronically signed by Heath Lark on 06/19/2023 at 6:22:25 PM.    Final    CT ABDOMEN PELVIS W CONTRAST  Result Date: 06/18/2023 CLINICAL DATA:  Severe pain and redness around the umbilicus. Appendectomy. Hernia suspected. EXAM: CT ABDOMEN AND PELVIS WITH CONTRAST TECHNIQUE: Multidetector CT imaging of the abdomen and pelvis was performed using the standard protocol following bolus administration of intravenous contrast. RADIATION DOSE REDUCTION: This exam was performed according to the departmental dose-optimization program which includes automated exposure control, adjustment of the mA and/or kV according to patient size and/or use of iterative reconstruction technique. CONTRAST:  OMNIPAQUE IOHEXOL 300 MG/ML  SOLN COMPARISON:  CT abdomen and pelvis 01/30/2011 FINDINGS: Lower chest: No acute abnormality. Hepatobiliary: Hepatic steatosis. Normal gallbladder. No biliary dilation. Pancreas: Unremarkable. Spleen: Unremarkable.  Adrenals/Urinary Tract: Normal adrenal glands. No urinary calculi or hydronephrosis. Bladder is unremarkable. Stomach/Bowel: Normal caliber large and small bowel. No bowel wall thickening. Appendectomy. Stomach is within normal limits. Vascular/Lymphatic: No significant vascular findings are present. No enlarged abdominal or pelvic lymph nodes. Reproductive: Unremarkable. Other: No free intraperitoneal fluid or air. Fat containing  umbilical hernia. Stranding and soft tissue thickening about the umbilical hernia. No organized fluid collection or abscess. No soft tissue gas. Musculoskeletal: No acute fracture. IMPRESSION: 1. Fat containing umbilical hernia. Soft tissue thickening and stranding about the hernia. Correlate for strangulation. No organized fluid collection or abscess. 2. Hepatic steatosis. Electronically Signed   By: Minerva Fester M.D.   On: 06/18/2023 23:35    Pending Labs Unresulted Labs (From admission, onward)     Start     Ordered   06/27/23 0500  Creatinine, serum  (enoxaparin (LOVENOX)    CrCl >/= 30 ml/min)  Weekly,   R     Comments: while on enoxaparin therapy    06/20/23 1214   06/20/23 1211  HIV Antibody (routine testing w rflx)  (HIV Antibody (Routine testing w reflex) panel)  Once,   R        06/20/23 1214            Vitals/Pain Today's Vitals   06/20/23 0953 06/20/23 0954  BP:  (!) 164/88  Pulse:  66  Resp:  17  Temp:  97.8 F (36.6 C)  TempSrc:  Oral  SpO2:  100%  Weight: 100 kg   Height: 6\' 2"  (1.88 m)   PainSc: 4      Isolation Precautions No active isolations  Medications Medications  ceFAZolin (ANCEF) IVPB 2g/100 mL premix (has no administration in time range)  lactated ringers infusion (has no administration in time range)  enoxaparin (LOVENOX) injection 40 mg (has no administration in time range)  metoprolol tartrate (LOPRESSOR) injection 5 mg (has no administration in time range)  simethicone (MYLICON) chewable tablet 40 mg (has no  administration in time range)  ondansetron (ZOFRAN-ODT) disintegrating tablet 4 mg (has no administration in time range)    Or  ondansetron (ZOFRAN) injection 4 mg (has no administration in time range)  prochlorperazine (COMPAZINE) tablet 10 mg (has no administration in time range)    Or  prochlorperazine (COMPAZINE) injection 5-10 mg (has no administration in time range)  polyethylene glycol (MIRALAX / GLYCOLAX) packet 17 g (has no administration in time range)  diphenhydrAMINE (BENADRYL) capsule 25 mg (has no administration in time range)    Or  diphenhydrAMINE (BENADRYL) injection 25 mg (has no administration in time range)  methocarbamol (ROBAXIN) tablet 500 mg (has no administration in time range)    Or  methocarbamol (ROBAXIN) 500 mg in dextrose 5 % 50 mL IVPB (has no administration in time range)  morphine (PF) 2 MG/ML injection 2-4 mg (has no administration in time range)  oxyCODONE (Oxy IR/ROXICODONE) immediate release tablet 5-10 mg (has no administration in time range)  acetaminophen (TYLENOL) tablet 650 mg (has no administration in time range)    Or  acetaminophen (TYLENOL) suppository 650 mg (has no administration in time range)  morphine (PF) 4 MG/ML injection 4 mg (4 mg Intravenous Given 06/20/23 1029)    Mobility walks

## 2023-06-20 NOTE — Consult Note (Signed)
[facility] Surgery Admission Note  Isaac Vasquez [birth year]  [id number].    Requesting MD: Vivi Barrack Chief Complaint/Reason for Consult: umbilical hernia  HPI:  Isaac Vasquez is a 52 y.o. male with no significant PMH who presented to the ED today with chief complaint abdominal pain. He was in the ED 2 days ago with the same complaint. He was found to have an umbilical hernia with strangulated fat. He was given 1 dose of IV antibiotics in the ED, prescribed oral antibiotics (which he has not started yet) and advised to follow up with surgery in the office. He returns to the ED today because he woke up and there is blood draining from the area. It is very tender to touch. No fevers. Denies nausea or vomiting. Last BM yesterday and he is passing flatus today. He has had nothing to eat/drink today.  Abdominal surgical history: laparoscopic appendectomy 2012 Anticoagulants: none Nonsmoker Employment: postal service   Family History  Problem Relation Age of Onset   Breast cancer Mother    Cancer Mother        breast   Prostate cancer Father    Cancer Father 36       prostate   Diabetes Neg Hx     Past Medical History:  Diagnosis Date   Anxiety    Appendicitis    Depression    OSA on CPAP    Vertigo     Past Surgical History:  Procedure Laterality Date   APPENDECTOMY  01/15/11    Social History:  reports that he has never smoked. He has never used smokeless tobacco. He reports current alcohol use. He reports that he does not use drugs.  Allergies: No Known Allergies  (Not in a hospital admission)   Prior to Admission medications   Medication Sig Start Date End Date Taking? Authorizing Provider  amoxicillin-clavulanate (AUGMENTIN) 875-125 MG tablet Take 1 tablet by mouth every 12 (twelve) hours. Patient not taking: Reported on 06/18/2023 02/03/22   Renne Crigler, PA-C  cetirizine (ZYRTEC) 10 MG tablet Take 10 mg by mouth daily.    [provider]  HYDROcodone-acetaminophen (NORCO/VICODIN) 5-325 MG tablet Take 1 tablet by mouth every 6 (six) hours as needed for up to 5 days. 06/19/23 06/24/23  Darrick Grinder, PA-C  metroNIDAZOLE (FLAGYL) 500 MG tablet Take 1 tablet (500 mg total) by mouth 2 (two) times daily. 06/19/23   Darrick Grinder, PA-C  Multiple Vitamin (MULTIVITAMIN WITH MINERALS) TABS tablet Take 1 tablet by mouth daily.    [provider]  sulfamethoxazole-trimethoprim (BACTRIM DS) 800-160 MG tablet Take 1 tablet by mouth 2 (two) times daily for 3 days. 06/19/23 06/22/23  Darrick Grinder, PA-C    Blood pressure (!) 164/88, pulse 66, temperature 97.8 F (36.6 C), temperature source Oral, resp. rate 17, height 6\' 2"  (1.88 m), weight 100 kg, SpO2 100%. Physical Exam: General: pleasant, WD/WN male who is laying in bed in NAD HEENT: head is normocephalic, atraumatic.  Sclera are noninjected.  Pupils equal and round.  Ears and nose without any masses or lesions.  Mouth is pink and moist. Dentition fair Heart: regular, rate, and rhythm.  Normal s1,s2. No obvious murmurs, gallops, or rubs noted.  Palpable radial and pedal pulses bilaterally  Lungs: CTAB, no wheezes, rhonchi, or rales noted.  Respiratory effort nonlabored Abd: soft, ND, +BS, no masses or organomegaly. Umbilical hernia with surrounding erythema and pinpoint area of skin breakdown MS: no BUE/BLE edema,  calves soft and nontender Skin: warm and dry with no masses, lesions, or rashes Psych: A&Ox4 with an appropriate affect Neuro: MAEs, no gross motor or sensory deficits BUE/BLE  Results for orders placed or performed during the hospital encounter of 06/20/23 (from the past 48 hour(s))  CBC with Differential     Status: Abnormal   Collection Time: 06/20/23 10:30 AM  Result Value Ref Range   WBC 3.3 (L) 4.0 - 10.5 K/uL   RBC 4.36 4.22 - 5.81 MIL/uL   Hemoglobin 12.9 (L) 13.0 - 17.0 g/dL   HCT 16.1 09.6 - 04.5 %   MCV 89.7 80.0 - 100.0 fL   MCH 29.6 26.0 - 34.0  pg   MCHC 33.0 30.0 - 36.0 g/dL   RDW 40.9 81.1 - 91.4 %   Platelets 167 150 - 400 K/uL   nRBC 0.0 0.0 - 0.2 %   Neutrophils Relative % 56 %   Neutro Abs 1.9 1.7 - 7.7 K/uL   Lymphocytes Relative 25 %   Lymphs Abs 0.8 0.7 - 4.0 K/uL   Monocytes Relative 15 %   Monocytes Absolute 0.5 0.1 - 1.0 K/uL   Eosinophils Relative 2 %   Eosinophils Absolute 0.1 0.0 - 0.5 K/uL   Basophils Relative 1 %   Basophils Absolute 0.0 0.0 - 0.1 K/uL   Immature Granulocytes 1 %   Abs Immature Granulocytes 0.02 0.00 - 0.07 K/uL    Comment: Performed at [facility], [address], [place], [place] [address]  Comprehensive metabolic panel     Status: Abnormal   Collection Time: 06/20/23 10:30 AM  Result Value Ref Range   Sodium 138 135 - 145 mmol/L   Potassium 3.9 3.5 - 5.1 mmol/L   Chloride 105 98 - 111 mmol/L   CO2 23 22 - 32 mmol/L   Glucose, Bld 100 (H) 70 - 99 mg/dL    Comment: Glucose reference range applies only to samples taken after fasting for at least 8 hours.   BUN 15 6 - 20 mg/dL   Creatinine, Ser 6.21 0.61 - 1.24 mg/dL   Calcium 8.7 (L) 8.9 - 10.3 mg/dL   Total Protein 7.4 6.5 - 8.1 g/dL   Albumin 4.0 3.5 - 5.0 g/dL   AST 16 15 - 41 U/L   ALT 14 0 - 44 U/L   Alkaline Phosphatase 50 38 - 126 U/L   Total Bilirubin 0.6 0.3 - 1.2 mg/dL   GFR, Estimated >30 >86 mL/min    Comment: (NOTE) Calculated using the CKD-EPI Creatinine Equation (2021)    Anion gap 10 5 - 15    Comment: Performed at [facility], [address], [place], [place] [address]   LE Venous  Result Date: 06/19/2023  Lower Venous DVT Study Patient Name:  Isaac Vasquez  Date of Exam:   06/19/2023 Medical Rec #: [id number]            Accession #:    [id number] Date of Birth: 1970-10-17            Patient Gender: M Patient Age:   23 years Exam Location:  [facility] Procedure:      VAS [place] LOWER EXTREMITY VENOUS (DVT) Referring Phys: Barrie Dunker  --------------------------------------------------------------------------------  Indications: Pain in right calf.  Risk Factors: Surgery Appendectomy 9-10 years ago. Comparison Study: No priors. Performing Technologist: Marilynne Halsted RDMS, RVT  Examination Guidelines: A complete evaluation includes B-mode imaging, spectral Doppler, color Doppler, and  power Doppler as needed of all accessible portions of each vessel. Bilateral testing is considered an integral part of a complete examination. Limited examinations for reoccurring indications may be performed as noted. The reflux portion of the exam is performed with the patient in reverse Trendelenburg.  +---------+---------------+---------+-----------+----------+--------------+ RIGHT    CompressibilityPhasicitySpontaneityPropertiesThrombus Aging +---------+---------------+---------+-----------+----------+--------------+ CFV      Full           Yes      Yes                                 +---------+---------------+---------+-----------+----------+--------------+ SFJ      Full                                                        +---------+---------------+---------+-----------+----------+--------------+ FV Prox  Full                                                        +---------+---------------+---------+-----------+----------+--------------+ FV Mid   Full                                                        +---------+---------------+---------+-----------+----------+--------------+ FV DistalFull                                                        +---------+---------------+---------+-----------+----------+--------------+ PFV      Full                                                        +---------+---------------+---------+-----------+----------+--------------+ POP      Full           Yes      Yes                                  +---------+---------------+---------+-----------+----------+--------------+ PTV      Full                                                        +---------+---------------+---------+-----------+----------+--------------+ PERO     Full                                                        +---------+---------------+---------+-----------+----------+--------------+   +----+---------------+---------+-----------+----------+--------------+  LEFTCompressibilityPhasicitySpontaneityPropertiesThrombus Aging +----+---------------+---------+-----------+----------+--------------+ CFV Full           Yes      Yes                                 +----+---------------+---------+-----------+----------+--------------+ SFJ Full                                                        +----+---------------+---------+-----------+----------+--------------+    Summary: RIGHT: - There is no evidence of deep vein thrombosis in the lower extremity.  - No cystic structure found in the popliteal fossa.  LEFT: - No evidence of common femoral vein obstruction.   *See table(s) above for measurements and observations. Electronically signed by Heath Lark on 06/19/2023 at 6:22:25 PM.    Final    CT ABDOMEN PELVIS W CONTRAST  Result Date: 06/18/2023 CLINICAL DATA:  Severe pain and redness around the umbilicus. Appendectomy. Hernia suspected. EXAM: CT ABDOMEN AND PELVIS WITH CONTRAST TECHNIQUE: Multidetector CT imaging of the abdomen and pelvis was performed using the standard protocol following bolus administration of intravenous contrast. RADIATION DOSE REDUCTION: This exam was performed according to the departmental dose-optimization program which includes automated exposure control, adjustment of the mA and/or kV according to patient size and/or use of iterative reconstruction technique. CONTRAST:  OMNIPAQUE IOHEXOL 300 MG/ML  SOLN COMPARISON:  CT abdomen and pelvis 01/30/2011 FINDINGS: Lower chest: No  acute abnormality. Hepatobiliary: Hepatic steatosis. Normal gallbladder. No biliary dilation. Pancreas: Unremarkable. Spleen: Unremarkable. Adrenals/Urinary Tract: Normal adrenal glands. No urinary calculi or hydronephrosis. Bladder is unremarkable. Stomach/Bowel: Normal caliber large and small bowel. No bowel wall thickening. Appendectomy. Stomach is within normal limits. Vascular/Lymphatic: No significant vascular findings are present. No enlarged abdominal or pelvic lymph nodes. Reproductive: Unremarkable. Other: No free intraperitoneal fluid or air. Fat containing umbilical hernia. Stranding and soft tissue thickening about the umbilical hernia. No organized fluid collection or abscess. No soft tissue gas. Musculoskeletal: No acute fracture. IMPRESSION: 1. Fat containing umbilical hernia. Soft tissue thickening and stranding about the hernia. Correlate for strangulation. No organized fluid collection or abscess. 2. Hepatic steatosis. Electronically Signed   By: Minerva Fester M.D.   On: 06/18/2023 23:35      Assessment/Plan Umbilical hernia with strangulated fat - Patient with an umbilical hernia containing strangulated fat. This is his second ED visit for this this week. Recommend admission for surgery, open umbilical hernia repair. He is agreeable. Keep NPO. Plan for surgery today pending OR availability.  ID - ancef  VTE - SCDs, plan lovenox postop FEN - IVF, NPO Foley - none    I reviewed ED provider notes, last 24 h vitals and pain scores, last 48 h intake and output, last 24 h labs and trends, and last 24 h imaging results.   Franne Forts, PA-C [facility] Surgery 06/20/2023, 11:56 AM Please see Amion for pager number during day hours 7:00am-4:30pm

## 2023-06-21 ENCOUNTER — Encounter (HOSPITAL_COMMUNITY): Payer: Self-pay | Admitting: General Surgery

## 2023-06-21 MED ORDER — ACETAMINOPHEN 325 MG PO TABS: 650.0000 mg | ORAL_TABLET | Freq: Four times a day (QID) | ORAL | Status: DC | PRN

## 2023-06-21 NOTE — Discharge Summary (Signed)
[facility] Discharge Summary   Patient ID: Isaac Vasquez MRN: [id number] DOB/AGE: 1971/05/22 52 y.o.  Admit date: 06/20/2023 Discharge date: 06/21/2023  Admitting Diagnosis: Umbilical hernia with cellulitis   Discharge Diagnosis Umbilical hernia with cellulitis s/p repair   Consultants None   Imaging: LE Venous  Result Date: 06/19/2023  Lower Venous DVT Study Patient Name:  Isaac Vasquez  Date of Exam:   06/19/2023 Medical Rec #: [id number]            Accession #:    [id number] Date of Birth: December 13, 1970            Patient Gender: M Patient Age:   69 years Exam Location:  [facility] Procedure:      VAS [place] LOWER EXTREMITY VENOUS (DVT) Referring Phys: Barrie Dunker --------------------------------------------------------------------------------  Indications: Pain in right calf.  Risk Factors: Surgery Appendectomy 9-10 years ago. Comparison Study: No priors. Performing Technologist: Marilynne Halsted RDMS, RVT  Examination Guidelines: A complete evaluation includes B-mode imaging, spectral Doppler, color Doppler, and power Doppler as needed of all accessible portions of each vessel. Bilateral testing is considered an integral part of a complete examination. Limited examinations for reoccurring indications may be performed as noted. The reflux portion of the exam is performed with the patient in reverse Trendelenburg.  +---------+---------------+---------+-----------+----------+--------------+ RIGHT    CompressibilityPhasicitySpontaneityPropertiesThrombus Aging +---------+---------------+---------+-----------+----------+--------------+ CFV      Full           Yes      Yes                                 +---------+---------------+---------+-----------+----------+--------------+ SFJ      Full                                                        +---------+---------------+---------+-----------+----------+--------------+ FV Prox  Full                                                         +---------+---------------+---------+-----------+----------+--------------+ FV Mid   Full                                                        +---------+---------------+---------+-----------+----------+--------------+ FV DistalFull                                                        +---------+---------------+---------+-----------+----------+--------------+ PFV      Full                                                        +---------+---------------+---------+-----------+----------+--------------+ POP  Full           Yes      Yes                                 +---------+---------------+---------+-----------+----------+--------------+ PTV      Full                                                        +---------+---------------+---------+-----------+----------+--------------+ PERO     Full                                                        +---------+---------------+---------+-----------+----------+--------------+   +----+---------------+---------+-----------+----------+--------------+ LEFTCompressibilityPhasicitySpontaneityPropertiesThrombus Aging +----+---------------+---------+-----------+----------+--------------+ CFV Full           Yes      Yes                                 +----+---------------+---------+-----------+----------+--------------+ SFJ Full                                                        +----+---------------+---------+-----------+----------+--------------+    Summary: RIGHT: - There is no evidence of deep vein thrombosis in the lower extremity.  - No cystic structure found in the popliteal fossa.  LEFT: - No evidence of common femoral vein obstruction.   *See table(s) above for measurements and observations. Electronically signed by Heath Lark on 06/19/2023 at 6:22:25 PM.    Final     Procedures Dr. Melody Haver (06/20/23) - umbilical hernia repair, primary    Hospital Course:  Patient is a 52 year old male who presented to the ED with abdominal pain and drainage from umbilical hernia.  Workup showed umbilical hernia with concern for cellulitis.  Patient was admitted and underwent procedure listed above.  Tolerated procedure well and was transferred to the floor.  Diet was advanced as tolerated.  On POD1, the patient was voiding well, tolerating diet, ambulating well, pain well controlled, vital signs stable, incisions c/d/i and felt stable for discharge home.  Patient will follow up in our office in 4 weeks and knows to call with questions or concerns.  He will call to confirm appointment date/time.    Physical Exam: General:  Alert, NAD, pleasant, comfortable Abd:  Soft, ND, mild tenderness, incision C/D/I   Allergies as of 06/21/2023   No Known Allergies      Medication List     STOP taking these medications    metroNIDAZOLE 500 MG tablet Commonly known as: FLAGYL   sulfamethoxazole-trimethoprim 800-160 MG tablet Commonly known as: Bactrim DS       TAKE these medications    acetaminophen 325 MG tablet Commonly known as: TYLENOL Take 2 tablets (650 mg total) by mouth every 6 (six) hours as needed for mild pain (or temp > 100).   amoxicillin-clavulanate 875-125 MG tablet Commonly known as: AUGMENTIN Take 1  tablet by mouth every 12 (twelve) hours.   cetirizine 10 MG tablet Commonly known as: ZYRTEC Take 10 mg by mouth daily.   HYDROcodone-acetaminophen 5-325 MG tablet Commonly known as: NORCO/VICODIN Take 1 tablet by mouth every 6 (six) hours as needed for up to 5 days.   multivitamin with minerals Tabs tablet Take 1 tablet by mouth daily.          Follow-up Information     Moise Boring, MD. Call.   Specialty: General Surgery Why: Call to confirm appointment date/time in approximately 4 weeks. Please arrive 30 min prior to appointment time to check in. Contact information: [address] [place] [address] [phone]                 Signed: Juliet Rude , [facility] Surgery 06/21/2023, 9:36 AM Please see Amion for pager number during day hours 7:00am-4:30pm

## 2023-06-21 NOTE — Discharge Instructions (Signed)
[facility]  UMBILICAL OR INGUINAL HERNIA REPAIR: POST OP INSTRUCTIONS  Always review your discharge instruction sheet given to you by the facility where your surgery was performed. IF YOU HAVE DISABILITY OR FAMILY LEAVE FORMS, YOU MUST BRING THEM TO THE OFFICE FOR PROCESSING.   DO NOT GIVE THEM TO YOUR DOCTOR.  1. A  prescription for pain medication may be given to you upon discharge.  Take your pain medication as prescribed, if needed.  If narcotic pain medicine is not needed, then you may take acetaminophen (Tylenol) or ibuprofen (Advil) as needed. 2. Take your usually prescribed medications unless otherwise directed. If you need a refill on your pain medication, please contact your pharmacy.  They will contact our office to request authorization. Prescriptions will not be filled after 5 pm or on week-ends. 3. You should follow a light diet the first 24 hours after arrival home, such as soup and crackers, etc.  Be sure to include lots of fluids daily.  Resume your normal diet the day after surgery. 4.Most patients will experience some swelling and bruising around the umbilicus or in the groin and scrotum.  Ice packs and reclining will help.  Swelling and bruising can take several days to resolve.  6. It is common to experience some constipation if taking pain medication after surgery.  Increasing fluid intake and taking a stool softener (such as Colace) will usually help or prevent this problem from occurring.  A mild laxative (Milk of Magnesia or Miralax) should be taken according to package directions if there are no bowel movements after 48 hours. 7. Unless discharge instructions indicate otherwise, you may remove your bandages 24-48 hours after surgery, and you may shower at that time.  You may have steri-strips (small skin tapes) in place directly over the incision.  These strips should be left on the skin for 7-10 days.  If your surgeon used skin glue on the  incision, you may shower in 24 hours.  The glue will flake off over the next 2-3 weeks.  Any sutures or staples will be removed at the office during your follow-up visit. 8. ACTIVITIES:  You may resume regular (light) daily activities beginning the next day--such as daily self-care, walking, climbing stairs--gradually increasing activities as tolerated.  You may have sexual intercourse when it is comfortable.  Refrain from any heavy lifting or straining until approved by your doctor.  a.You may drive when you are no longer taking prescription pain medication, you can comfortably wear a seatbelt, and you can safely maneuver your car and apply brakes.   9.You should see your doctor in the office for a follow-up appointment approximately 2-3 weeks after your surgery.  Make sure that you call for this appointment within a day or two after you arrive home to insure a convenient appointment time. 10.OTHER INSTRUCTIONS: _________________________    _____________________________________  WHEN TO CALL YOUR DOCTOR: Fever over 101.0 Inability to urinate Nausea and/or vomiting Extreme swelling or bruising Continued bleeding from incision. Increased pain, redness, or drainage from the incision  The clinic staff is available to answer your questions during regular business hours.  Please don't hesitate to call and ask to speak to one of the nurses for clinical concerns.  If you have a medical emergency, go to the nearest emergency room or call 911.  A surgeon from [facility] is always on call at the hospital   [address], [address], [place], [place]  [address] ?  [id number], [place], [place]   [address] [phone] ? [phone] ? FAX [phone] Web site: [email or web address]

## 2023-06-21 NOTE — Progress Notes (Signed)
Reviewed d/c instructions with pt. All questions answered. Pt taken to front entrance to meet ride for discharge.

## 2023-06-22 ENCOUNTER — Other Ambulatory Visit: Payer: Self-pay

## 2023-06-22 ENCOUNTER — Encounter (HOSPITAL_COMMUNITY): Payer: Self-pay | Admitting: Emergency Medicine

## 2023-06-22 ENCOUNTER — Emergency Department (HOSPITAL_COMMUNITY): Payer: 59

## 2023-06-22 ENCOUNTER — Observation Stay (HOSPITAL_COMMUNITY)
Admission: EM | Admit: 2023-06-22 | Discharge: 2023-06-23 | Disposition: A | Discharge status: Home / Self Care | Payer: 59 | Attending: General Surgery | Admitting: General Surgery

## 2023-06-22 DIAGNOSIS — L0390 Cellulitis, unspecified: Principal | ICD-10-CM | POA: Diagnosis present

## 2023-06-22 DIAGNOSIS — L03316 Cellulitis of umbilicus: Secondary | ICD-10-CM | POA: Diagnosis present

## 2023-06-22 LAB — CBC WITH DIFFERENTIAL/PLATELET
Abs Immature Granulocytes: 0.01 10*3/uL (ref 0.00–0.07)
Basophils Absolute: 0 10*3/uL (ref 0.0–0.1)
Basophils Relative: 1 %
Eosinophils Absolute: 0.1 10*3/uL (ref 0.0–0.5)
Eosinophils Relative: 1 %
HCT: 39.1 % (ref 39.0–52.0)
Hemoglobin: 12.8 g/dL — ABNORMAL LOW (ref 13.0–17.0)
Immature Granulocytes: 0 %
Lymphocytes Relative: 26 %
Lymphs Abs: 1.4 10*3/uL (ref 0.7–4.0)
MCH: 29.6 pg (ref 26.0–34.0)
MCHC: 32.7 g/dL (ref 30.0–36.0)
MCV: 90.5 fL (ref 80.0–100.0)
Monocytes Absolute: 0.6 10*3/uL (ref 0.1–1.0)
Monocytes Relative: 12 %
Neutro Abs: 3.1 10*3/uL (ref 1.7–7.7)
Neutrophils Relative %: 60 %
Platelets: 189 10*3/uL (ref 150–400)
RBC: 4.32 MIL/uL (ref 4.22–5.81)
RDW: 13.1 % (ref 11.5–15.5)
WBC: 5.2 10*3/uL (ref 4.0–10.5)
nRBC: 0 % (ref 0.0–0.2)

## 2023-06-22 LAB — BASIC METABOLIC PANEL
Anion gap: 7 (ref 5–15)
BUN: 14 mg/dL (ref 6–20)
CO2: 27 mmol/L (ref 22–32)
Calcium: 8.6 mg/dL — ABNORMAL LOW (ref 8.9–10.3)
Chloride: 103 mmol/L (ref 98–111)
Creatinine, Ser: 0.98 mg/dL (ref 0.61–1.24)
GFR, Estimated: >60 mL/min (ref >60)
Glucose, Bld: 87 mg/dL (ref 70–99)
Potassium: 3.6 mmol/L (ref 3.5–5.1)
Sodium: 137 mmol/L (ref 135–145)

## 2023-06-22 MED ORDER — OXYCODONE HCL 5 MG PO TABS: 5.0000 mg | ORAL_TABLET | ORAL | Status: DC | PRN

## 2023-06-22 MED ORDER — IBUPROFEN 400 MG PO TABS: 600.0000 mg | ORAL_TABLET | Freq: Four times a day (QID) | ORAL | Status: DC | PRN

## 2023-06-22 MED ORDER — IOHEXOL 300 MG/ML  SOLN
100.0000 mL | Freq: Once | INTRAMUSCULAR | Status: AC | PRN
  Administered 2023-06-22: 100 mL via INTRAVENOUS

## 2023-06-22 MED ORDER — CEFAZOLIN SODIUM-DEXTROSE 2-4 GM/100ML-% IV SOLN
2.0000 g | Freq: Once | INTRAVENOUS | Status: AC
  Administered 2023-06-22: 2 g via INTRAVENOUS
  Filled 2023-06-22: qty 100

## 2023-06-22 MED ORDER — CEFAZOLIN SODIUM-DEXTROSE 2-4 GM/100ML-% IV SOLN
2.0000 g | Freq: Three times a day (TID) | INTRAVENOUS | Status: DC
  Administered 2023-06-23: 2 g via INTRAVENOUS
  Filled 2023-06-22 (×2): qty 100

## 2023-06-22 MED ORDER — LACTATED RINGERS IV SOLN: INTRAVENOUS | Status: DC

## 2023-06-22 MED ORDER — ACETAMINOPHEN 500 MG PO TABS
1000.0000 mg | ORAL_TABLET | Freq: Four times a day (QID) | ORAL | Status: DC
  Administered 2023-06-22: 1000 mg via ORAL
  Filled 2023-06-22 (×2): qty 2

## 2023-06-22 NOTE — H&P (Incomplete)
Isaac Vasquez 1971-05-07  [id number].    Chief Complaint/Reason for Consult: Umbilical Cellulitis   HPI:  52 y/o M who presented to the ED earlier this week and was diagnosed with an umbilical hernia.  At the time of his operation, it was noted that he had a draining wound at the umbilicus with overlying cellulitis.  He was taken to the OR for an uneventful repair and discharged on POD 1 with a plan to continue antibiotics.  He presented today with persistent erythema near his incision as well as some incisional pain and drainage.  WBC 5.  He is AF and HDS.  CT shows a small fluid collection related to this umbilical repair with overlying cellulitis.    ROS: Review of Systems  Constitutional: Negative.   HENT: Negative.    Eyes: Negative.   Respiratory: Negative.    Cardiovascular: Negative.   Gastrointestinal: Negative.   Genitourinary: Negative.   Musculoskeletal: Negative.   Skin:        Erythema near the umbilicus   Neurological: Negative.   Endo/Heme/Allergies: Negative.   Psychiatric/Behavioral: Negative.      Family History  Problem Relation Age of Onset   Breast cancer Mother    Cancer Mother        breast   Prostate cancer Father    Cancer Father 70       prostate   Diabetes Neg Hx     Past Medical History:  Diagnosis Date   Anxiety    Appendicitis    Depression    OSA on CPAP    Vertigo     Past Surgical History:  Procedure Laterality Date   APPENDECTOMY  01/15/11   UMBILICAL HERNIA REPAIR N/A 06/20/2023   Procedure: HERNIA REPAIR UMBILICAL ADULT;  Surgeon: Moise Boring, MD;  Location: [facility];  Service: General;  Laterality: N/A;    Social History:  reports that he has never smoked. He has never used smokeless tobacco. He reports current alcohol use. He reports that he does not use drugs.  Allergies: No Known Allergies  Medications Prior to Admission  Medication Sig Dispense Refill   acetaminophen (TYLENOL) 500 MG tablet Take 1,000 mg  by mouth as needed for mild pain or moderate pain.     cetirizine (ZYRTEC) 10 MG tablet Take 10 mg by mouth daily.     ibuprofen (ADVIL) 200 MG tablet Take 400 mg by mouth as needed for mild pain or moderate pain.     Multiple Vitamin (MULTIVITAMIN WITH MINERALS) TABS tablet Take 1 tablet by mouth daily.     HYDROcodone-acetaminophen (NORCO/VICODIN) 5-325 MG tablet Take 1 tablet by mouth every 6 (six) hours as needed for up to 5 days. (Patient not taking: Reported on 06/20/2023) 20 tablet 0    Physical Exam: Blood pressure (!) 146/95, pulse 65, temperature 98.2 F (36.8 C), resp. rate 18, SpO2 98%. Gen: male, NAD Resp: no increased WOB CV: RRR Abd: soft, non-distended, incision with dermabond intact, no drainage, minimal surrounding erythema Neuro: moving all extremities  Results for orders placed or performed during the hospital encounter of 06/22/23 (from the past 48 hour(s))  CBC with Differential/Platelet     Status: Abnormal   Collection Time: 06/22/23  3:42 PM  Result Value Ref Range   WBC 5.2 4.0 - 10.5 K/uL   RBC 4.32 4.22 - 5.81 MIL/uL   Hemoglobin 12.8 (L) 13.0 - 17.0 g/dL   HCT 21.3 08.6 - 57.8 %  MCV 90.5 80.0 - 100.0 fL   MCH 29.6 26.0 - 34.0 pg   MCHC 32.7 30.0 - 36.0 g/dL   RDW 93.2 35.5 - 73.2 %   Platelets 189 150 - 400 K/uL   nRBC 0.0 0.0 - 0.2 %   Neutrophils Relative % 60 %   Neutro Abs 3.1 1.7 - 7.7 K/uL   Lymphocytes Relative 26 %   Lymphs Abs 1.4 0.7 - 4.0 K/uL   Monocytes Relative 12 %   Monocytes Absolute 0.6 0.1 - 1.0 K/uL   Eosinophils Relative 1 %   Eosinophils Absolute 0.1 0.0 - 0.5 K/uL   Basophils Relative 1 %   Basophils Absolute 0.0 0.0 - 0.1 K/uL   Immature Granulocytes 0 %   Abs Immature Granulocytes 0.01 0.00 - 0.07 K/uL    Comment: Performed at [facility], [address], [place], [place] [address]  Basic metabolic panel     Status: Abnormal   Collection Time: 06/22/23  3:42 PM  Result Value Ref Range   Sodium 137  135 - 145 mmol/L   Potassium 3.6 3.5 - 5.1 mmol/L   Chloride 103 98 - 111 mmol/L   CO2 27 22 - 32 mmol/L   Glucose, Bld 87 70 - 99 mg/dL    Comment: Glucose reference range applies only to samples taken after fasting for at least 8 hours.   BUN 14 6 - 20 mg/dL   Creatinine, Ser 2.70 0.61 - 1.24 mg/dL   Calcium 8.6 (L) 8.9 - 10.3 mg/dL   GFR, Estimated >62 >37 mL/min    Comment: (NOTE) Calculated using the CKD-EPI Creatinine Equation (2021)    Anion gap 7 5 - 15    Comment: Performed at [facility], [address], [place], [place] [address]   CT ABDOMEN PELVIS W CONTRAST  Result Date: 06/22/2023 CLINICAL DATA:  Acute abdominal pain. Two days postop from hernia repair. Erythema and leaking from incision site. EXAM: CT ABDOMEN AND PELVIS WITH CONTRAST TECHNIQUE: Multidetector CT imaging of the abdomen and pelvis was performed using the standard protocol following bolus administration of intravenous contrast. RADIATION DOSE REDUCTION: This exam was performed according to the departmental dose-optimization program which includes automated exposure control, adjustment of the mA and/or kV according to patient size and/or use of iterative reconstruction technique. CONTRAST:  OMNIPAQUE IOHEXOL 300 MG/ML  SOLN COMPARISON:  06/18/2023 FINDINGS: Lower Chest: No acute findings. Hepatobiliary: No suspicious hepatic masses identified. Gallbladder is unremarkable. No evidence of biliary ductal dilatation. Pancreas:  No mass or inflammatory changes. Spleen: Within normal limits in size and appearance. Adrenals/Urinary Tract: No suspicious masses identified. No evidence of ureteral calculi or hydronephrosis. Stomach/Bowel: No evidence of obstruction, bowel wall thickening, or abnormal fluid collections. Patient has undergone umbilical hernia repair since prior study. Inflammatory changes are seen in the anterior abdominal wall subcutaneous fat at the operative site. A poorly defined fluid  and gas collection is also seen centrally at this site measuring 2.3 x 2.2 cm. No evidence of recurrent hernia. Vascular/Lymphatic: No pathologically enlarged lymph nodes. No acute vascular findings. Reproductive:  No mass or other significant abnormality. Other:  None. Musculoskeletal:  No suspicious bone lesions identified. IMPRESSION: 2.3 cm poorly defined fluid and gas collection with surrounding inflammatory changes at the operative site in the anterior abdominal wall subcutaneous tissues. No evidence of recurrent hernia. Electronically Signed   By: Danae Orleans M.D.   On: 06/22/2023 17:55    Assessment/Plan 52 y/o  M s/p umbilical hernia repair and debridement of abdominal wall infection who presented with persistent cellulitis   - Plan to admission for overnight IV abx and pain control with likely discharge in the morning - I emphasized the importance of him filling his antibiotic prescription and completing the course as prescribed  I reviewed last 24 h vitals and pain scores, last 24 h labs and trends, and last 24 h imaging results.  Tacy [facility] 06/22/2023, 7:58 PM Please see Amion for pager number during day hours 7:00am-4:30pm or 7:00am -11:30am on weekends

## 2023-06-22 NOTE — ED Triage Notes (Signed)
Pt reports hernia repair 2 days ago. PT states there is redness around the incision site and blood leaking from the site. Denies fevers/n/v.

## 2023-06-22 NOTE — ED Notes (Signed)
ED TO INPATIENT HANDOFF REPORT  Name/Age/Gender Isaac Vasquez 52 y.o. male  Code Status Code Status History     Date Active Date Inactive Code Status Order ID Comments User Context   06/20/2023 1214 06/21/2023 1631 Full Code [id number]  Franne Forts, PA-C ED    Questions for Most Recent Historical Code Status (Order [id number])     Question Answer   By: Consent: discussion documented in EHR            Home/SNF/Other Home  Chief Complaint Cellulitis [L03.90]  Level of Care/Admitting Diagnosis ED Disposition     ED Disposition  Admit   Condition  --   Comment  Hospital Area: [facility] [[id number]]  Level of Care: Med-Surg [16]  May admit patient to [facility] or [facility] if equivalent level of care is available:: No  Covid Evaluation: Asymptomatic - no recent exposure (last 10 days) testing not required  Diagnosis: Cellulitis [578469]  Admitting Physician: Moise Boring [[id number]]  Attending Physician: Moise Boring [[id number]]  Certification:: I certify this patient will need inpatient services for at least 2 midnights  Expected Medical Readiness: 06/24/2023          Medical History Past Medical History:  Diagnosis Date   Anxiety    Appendicitis    Depression    OSA on CPAP    Vertigo     Allergies No Known Allergies  IV Location/Drains/Wounds Patient Lines/Drains/Airways Status     Active Line/Drains/Airways     Name Placement date Placement time Site Days   Peripheral IV 06/22/23 20 G Right Antecubital 06/22/23  1533  Antecubital  less than 1            Labs/Imaging Results for orders placed or performed during the hospital encounter of 06/22/23 (from the past 48 hour(s))  CBC with Differential/Platelet     Status: Abnormal   Collection Time: 06/22/23  3:42 PM  Result Value Ref Range   WBC 5.2 4.0 - 10.5 K/uL   RBC 4.32 4.22 - 5.81 MIL/uL   Hemoglobin 12.8 (L) 13.0 - 17.0 g/dL   HCT 02.7 25.3 - 66.4 %    MCV 90.5 80.0 - 100.0 fL   MCH 29.6 26.0 - 34.0 pg   MCHC 32.7 30.0 - 36.0 g/dL   RDW 40.3 47.4 - 25.9 %   Platelets 189 150 - 400 K/uL   nRBC 0.0 0.0 - 0.2 %   Neutrophils Relative % 60 %   Neutro Abs 3.1 1.7 - 7.7 K/uL   Lymphocytes Relative 26 %   Lymphs Abs 1.4 0.7 - 4.0 K/uL   Monocytes Relative 12 %   Monocytes Absolute 0.6 0.1 - 1.0 K/uL   Eosinophils Relative 1 %   Eosinophils Absolute 0.1 0.0 - 0.5 K/uL   Basophils Relative 1 %   Basophils Absolute 0.0 0.0 - 0.1 K/uL   Immature Granulocytes 0 %   Abs Immature Granulocytes 0.01 0.00 - 0.07 K/uL    Comment: Performed at [facility], [address], [place], [place] [address]  Basic metabolic panel     Status: Abnormal   Collection Time: 06/22/23  3:42 PM  Result Value Ref Range   Sodium 137 135 - 145 mmol/L   Potassium 3.6 3.5 - 5.1 mmol/L   Chloride 103 98 - 111 mmol/L   CO2 27 22 - 32 mmol/L   Glucose, Bld 87 70 - 99 mg/dL    Comment: Glucose  reference range applies only to samples taken after fasting for at least 8 hours.   BUN 14 6 - 20 mg/dL   Creatinine, Ser 9.98 0.61 - 1.24 mg/dL   Calcium 8.6 (L) 8.9 - 10.3 mg/dL   GFR, Estimated >33 >82 mL/min    Comment: (NOTE) Calculated using the CKD-EPI Creatinine Equation (2021)    Anion gap 7 5 - 15    Comment: Performed at [facility], [address], [place], [place] [address]   CT ABDOMEN PELVIS W CONTRAST  Result Date: 06/22/2023 CLINICAL DATA:  Acute abdominal pain. Two days postop from hernia repair. Erythema and leaking from incision site. EXAM: CT ABDOMEN AND PELVIS WITH CONTRAST TECHNIQUE: Multidetector CT imaging of the abdomen and pelvis was performed using the standard protocol following bolus administration of intravenous contrast. RADIATION DOSE REDUCTION: This exam was performed according to the departmental dose-optimization program which includes automated exposure control, adjustment of the mA and/or kV according  to patient size and/or use of iterative reconstruction technique. CONTRAST:  OMNIPAQUE IOHEXOL 300 MG/ML  SOLN COMPARISON:  06/18/2023 FINDINGS: Lower Chest: No acute findings. Hepatobiliary: No suspicious hepatic masses identified. Gallbladder is unremarkable. No evidence of biliary ductal dilatation. Pancreas:  No mass or inflammatory changes. Spleen: Within normal limits in size and appearance. Adrenals/Urinary Tract: No suspicious masses identified. No evidence of ureteral calculi or hydronephrosis. Stomach/Bowel: No evidence of obstruction, bowel wall thickening, or abnormal fluid collections. Patient has undergone umbilical hernia repair since prior study. Inflammatory changes are seen in the anterior abdominal wall subcutaneous fat at the operative site. A poorly defined fluid and gas collection is also seen centrally at this site measuring 2.3 x 2.2 cm. No evidence of recurrent hernia. Vascular/Lymphatic: No pathologically enlarged lymph nodes. No acute vascular findings. Reproductive:  No mass or other significant abnormality. Other:  None. Musculoskeletal:  No suspicious bone lesions identified. IMPRESSION: 2.3 cm poorly defined fluid and gas collection with surrounding inflammatory changes at the operative site in the anterior abdominal wall subcutaneous tissues. No evidence of recurrent hernia. Electronically Signed   By: Danae Orleans M.D.   On: 06/22/2023 17:55    Pending Labs Unresulted Labs (From admission, onward)    None       Vitals/Pain Today's Vitals   06/22/23 1450 06/22/23 1454 06/22/23 1802  BP: (!) 201/99  (!) 146/95  Pulse: 66  65  Resp: 18  18  Temp: 98.2 F (36.8 C)  98.2 F (36.8 C)  TempSrc: Oral    SpO2: 98%  98%  PainSc:  0-No pain     Isolation Precautions No active isolations  Medications Medications  lactated ringers infusion ( Intravenous New Bag/Given 06/22/23 1533)  ceFAZolin (ANCEF) IVPB 2g/100 mL premix (has no administration in time range)   iohexol (OMNIPAQUE) 300 MG/ML solution 100 mL (100 mLs Intravenous Contrast Given 06/22/23 1638)    Mobility walks

## 2023-06-22 NOTE — ED Notes (Signed)
Patient transported to CT 

## 2023-06-22 NOTE — ED Provider Notes (Signed)
[place] EMERGENCY DEPARTMENT AT [facility] Provider Note   CSN: [id number] Arrival date & time: 06/22/23  1443     History  Chief Complaint  Patient presents with   Post-op Problem    Isaac Vasquez is a 52 y.o. male.  52 year old male status post hernia repair a few days ago presents due to increasing erythema around his surgical site.  Patient had umbilical hernia repair.  Complicated by cellulitis.  Is currently on antibiotics.  Denies any nausea or vomiting or fevers.  Notes increasing pain not relieved with Tylenol or Motrin.  Came in for further evaluation       Home Medications Prior to Admission medications   Medication Sig Start Date End Date Taking? Authorizing Provider  acetaminophen (TYLENOL) 325 MG tablet Take 2 tablets (650 mg total) by mouth every 6 (six) hours as needed for mild pain (or temp > 100). 06/21/23   Juliet Rude, PA-C  amoxicillin-clavulanate (AUGMENTIN) 875-125 MG tablet Take 1 tablet by mouth every 12 (twelve) hours. Patient not taking: Reported on 06/18/2023 02/03/22   Renne Crigler, PA-C  cetirizine (ZYRTEC) 10 MG tablet Take 10 mg by mouth daily.    [provider]  HYDROcodone-acetaminophen (NORCO/VICODIN) 5-325 MG tablet Take 1 tablet by mouth every 6 (six) hours as needed for up to 5 days. Patient not taking: Reported on 06/20/2023 06/19/23 06/24/23  Darrick Grinder, PA-C  Multiple Vitamin (MULTIVITAMIN WITH MINERALS) TABS tablet Take 1 tablet by mouth daily.    [provider]      Allergies    Patient has no known allergies.    Review of Systems   Review of Systems  All other systems reviewed and are negative.   Physical Exam Updated Vital Signs BP (!) 201/99 (BP Location: Left Arm)   Pulse 66   Temp 98.2 F (36.8 C) (Oral)   Resp 18   SpO2 98%  Physical Exam Vitals and nursing note reviewed.  Constitutional:      General: He is not in acute distress.    Appearance: Normal appearance. He is  well-developed. He is not toxic-appearing.  HENT:     Head: Normocephalic and atraumatic.  Eyes:     General: Lids are normal.     Conjunctiva/sclera: Conjunctivae normal.     Pupils: Pupils are equal, round, and reactive to light.  Neck:     Thyroid: No thyroid mass.     Trachea: No tracheal deviation.  Cardiovascular:     Rate and Rhythm: Normal rate and regular rhythm.     Heart sounds: Normal heart sounds. No murmur heard.    No gallop.  Pulmonary:     Effort: Pulmonary effort is normal. No respiratory distress.     Breath sounds: Normal breath sounds. No stridor. No decreased breath sounds, wheezing, rhonchi or rales.  Abdominal:     General: There is no distension.     Palpations: Abdomen is soft.     Tenderness: There is no abdominal tenderness. There is no rebound.    Musculoskeletal:        General: No tenderness. Normal range of motion.     Cervical back: Normal range of motion and neck supple.  Skin:    General: Skin is warm and dry.     Findings: No abrasion or rash.  Neurological:     Mental Status: He is alert and oriented to person, place, and time. Mental status is at baseline.  GCS: GCS eye subscore is 4. GCS verbal subscore is 5. GCS motor subscore is 6.     Cranial Nerves: Cranial nerves are intact. No cranial nerve deficit.     Sensory: No sensory deficit.     Motor: Motor function is intact.  Psychiatric:        Attention and Perception: Attention normal.        Speech: Speech normal.        Behavior: Behavior normal.     ED Results / Procedures / Treatments   Labs (all labs ordered are listed, but only abnormal results are displayed) Labs Reviewed  CBC WITH DIFFERENTIAL/PLATELET  BASIC METABOLIC PANEL    EKG None  Radiology No results found.  Procedures Procedures    Medications Ordered in ED Medications  lactated ringers infusion (has no administration in time range)    ED Course/ Medical Decision Making/ A&P                                  Medical Decision Making Amount and/or Complexity of Data Reviewed Labs: ordered. Radiology: ordered.  Risk Prescription drug management.   Patient here with abdominal wall discomfort from cellulitis.  Possible deep space infection noted.  White count is normal.  Abdominal CT performed per my interpretation shows no large abscess.  Likely just postop changes.  Discussed with patient's surgeon, Dr. Hillery Hunter, who reviewed the patient's films.  Had discussion on the phone between the surgeon, the patient about neck steps.  Plan will be to start Ancef and admit for overnight observation.        Final Clinical Impression(s) / ED Diagnoses Final diagnoses:  None    Rx / DC Orders ED Discharge Orders     None         Lorre Nick, MD 06/22/23 [phone]

## 2023-06-23 LAB — CBC
HCT: 36.4 % — ABNORMAL LOW (ref 39.0–52.0)
Hemoglobin: 11.8 g/dL — ABNORMAL LOW (ref 13.0–17.0)
MCH: 29 pg (ref 26.0–34.0)
MCHC: 32.4 g/dL (ref 30.0–36.0)
MCV: 89.4 fL (ref 80.0–100.0)
Platelets: 191 10*3/uL (ref 150–400)
RBC: 4.07 MIL/uL — ABNORMAL LOW (ref 4.22–5.81)
RDW: 13.1 % (ref 11.5–15.5)
WBC: 4.1 10*3/uL (ref 4.0–10.5)
nRBC: 0 % (ref 0.0–0.2)

## 2023-06-23 LAB — BASIC METABOLIC PANEL
Anion gap: 10 (ref 5–15)
BUN: 13 mg/dL (ref 6–20)
CO2: 24 mmol/L (ref 22–32)
Calcium: 8.3 mg/dL — ABNORMAL LOW (ref 8.9–10.3)
Chloride: 102 mmol/L (ref 98–111)
Creatinine, Ser: 0.94 mg/dL (ref 0.61–1.24)
GFR, Estimated: >60 mL/min (ref >60)
Glucose, Bld: 115 mg/dL — ABNORMAL HIGH (ref 70–99)
Potassium: 3.7 mmol/L (ref 3.5–5.1)
Sodium: 136 mmol/L (ref 135–145)

## 2023-06-23 LAB — PHOSPHORUS: Phosphorus: 3.7 mg/dL (ref 2.5–4.6)

## 2023-06-23 LAB — MAGNESIUM: Magnesium: 2.1 mg/dL (ref 1.7–2.4)

## 2023-06-23 MED ORDER — TRAMADOL HCL 50 MG PO TABS
100.0000 mg | ORAL_TABLET | Freq: Four times a day (QID) | ORAL | Status: DC | PRN
  Filled 2023-06-23: qty 2

## 2023-06-23 NOTE — Discharge Instructions (Signed)
Home Care After Hernia Repair  **You had signs of a skin infection at the time of surgery - it is essential that you fill the prescription for your antibiotic and complete the course as prescribed**  Activity  Limit activity for the first 24 hours, then you may return to normal daily activities. Returning to normal daily activities as soon as you can following surgery will enhance recovery time.  No heavy lifting pushing or pulling, anything heavier than 10 pounds (gallon of milk weighs approx. 8.8 pounds) for 4-6 weeks from surgery date.  Do not mow the lawn, use a vacuum cleaner, or do any other strenuous activities without first consulting your surgical team.  Climb stairs slowly and watch your step.  Walk as often as you feel able to increase strength and endurance.  No driving or operating heavy machinery within 24 hours of taking narcotic pain medication.  Abdominal Core Rehab if referral placed for you.  If you think you could benefit from rehab please talk with your surgeon.  For additional information about your recovery and activities you can do to help your recover, please download the [place] [facility]) app to your phone and follow the instructions.  The app can be found by clicking one of the links below, by entering [facility]' in your Appstore, or by opening your camera app and following one of the links in the QR codes below:  Diet  Drink plenty of fluids and eat a light meal on the night of surgery. Some patients may find their appetite is poor for a week or two after surgery. This is a normal result of the stress of surgery-your appetite will return in time.   There are no specific diet restrictions after surgery.  Dressings and Wound Care  Keep your wound or incision site clean and dry.  You may have different types of dressings covering your incisions depending on your operation and your surgeon: o Dermabond/Durabond (skin glue): This will  usually remain in place for 10-14 days, then naturally fall off your skin. You may take a shower 24 hrs after surgery, carefully wash, not scrub the incision site with a mild non-scented soap. Pat dry with a soft towel.  Do not pick or peel skin glue off.  Do not use creams, powder, salves or balms on your incision(s).    What to Expect After Surgery   Moderate discomfort controlled with medications  Minimal drainage from incision  You may feel pain in one or both shoulders. This pain comes from the gas still left in your belly after the surgery, if you had laparoscopic surgery (several small incisions). The pain should ease over several days to a week. Ambulation will help with this pain.   Belly swelling  Feeling fatigue and weak  Constipation after surgery is common. Drink plenty fluids and eat a high fiber diet.  Swelling - In some patients might feel that their hernia has returned after surgery-DO NOT Worry this is normal. Swelling may be due to the development of a seroma. Seroma is fluid that has built up where the hernia was repaired this is a normal result after surgery and it will slowly reabsorb back into your body over the next several weeks.   Pain Control: Prescribed Non-Narcotic Pain Medication  You will be given three prescriptions.  Two of them will be for prescription strength ibuprofen (i.e. Advil) and prescription strength acetaminophen (i.e. Tylenol).  The vast majority of patients will just need  these two medications.  One prescription will be for a 'rescue' prescription of an oral narcotic (oxycodone).  You may fill this if needed.  You will alternate taking the ibuprofen (600mg ) every 6 hours and also the acetaminophen (650mg ) every 6 hours so that you are taking one of those medications every 3 hours.  For example: o 0800 - take ibuprofen 600mg  o 1100 - take acetaminophen 650mg  o 1400 - take ibuprofen 600mg  o 1700 - take acetaminophen 650mg  o Etc.  Continue taking this  alternating pattern of ibuprofen and acetaminophen for 3 days  If you cannot take one or the other of these medications, just take the one you can every 6 hours.  If you are comfortable at night, you don't have to wake up and take a medication.  If you are still uncomfortable after taking either ibuprofen or acetaminophen, try gentle stretching exercise and ice packs (a bag of frozen vegetables works great).  If you are still uncomfortable, you may fill the narcotic prescription of Oxycodone and take as directed.  Once you have completed these prescriptions, your pain level should be low enough to stop taking medications altogether or just use an over the counter medication (ibuprofen or acetaminophen) as needed.   Pain Control: Over the Counter Medications to take as needed:  Colace/Docusate: May be prescribed by your surgeon to prevent constipation caused by the combination of narcotics, effects of anesthesia, and decreased ambulation.  Hold for loose stools or diarrhea. Take 100 mg 1-2 times a day starting tonight.   Fiber: High fiber foods, extra liquids (water 9-13 cups/day) can also assist with constipation. Examples of high fiber foods are fruit, bran. Prune juice and water are also good liquids to drink.  Milk of Magnesia/Miralax:  If constipated despite take the over the counter stool softeners, you may take Milk of Magnesia or Miralax as directed on bottle to assist with constipation.     Pepcid/Famotidine: May be prescribed while taking naproxen (Aleve) or other NSAIDs such as ibuprofen (Motrin/Advil) to prevent stomach upset or Acid-reflux symptoms. Take 1 tablet 1-2 times a day.   **Constipation: The first bowel movement may occur anywhere between 1-5 days after surgery.  As long as you are not nauseated or not having significant abdominal pain this variation is acceptable.  Narcotic pain medications can cause constipation increasing discomfort; early discontinuation will assist with bowel  management.If constipated despite taking stool softeners, you may take Milk of Magnesia or Miralax as directed on the bottle.     **Home medications: You may restart your home medications as directed by your respective Primary Care Physician or Surgeon.  When to notify your Doctor or Healthcare Team:   Sign of Wound Infection   Fever over 100 degrees.  Wound becomes extremely swollen, shows red streaks, warm to the touch, and/or drainage from the incision site or foul-smelling drainage.  Wound edges separate or opens up  Bleeding or bruising   If you have bleeding, apply pressure to the site and hold the pressure firmly for 5 minutes. If the bleeding continues, apply pressure again and call 911. If the bleeding stopped, call your doctor to report it.   Call your doctor or nurse if you have increased bleeding from your site and increased bruising or a lump forms or gets larger under your skin at the site.  Unrelieved Pain    Call your doctor or nurse if your pain gets worse or is not eased 1 hour after taking your pain  medicine, or if it is severe and uncontrolled.  Nausea and Vomiting   Call your doctor or nurse if you have nausea and vomiting that continues more than 24 hours, will not let you keep medicine down and will not let you keep fluids down  Fever, Flu-like symptoms   Fever over 100 degrees and/or chills  Gastrointestinal Bleeding Symptoms    Black tarry bowel movements.  This can be normal after surgery on the stomach, but should resolve in a day or two.    Call 911 if you suddenly have signs of blood loss such as:  Vomiting blood  Fast heart rate  Feeling faint, sweaty, or blacking out  Passing bright red blood from your rectum  Blood Clot Symptoms   Tender, swollen or reddened areas in your calf muscle or thighs.  Numbness or tingling in your lower leg or calf, or at the top of your leg or groin  Skin on your leg looks pale or blue or feels cold to touch  Chest pain  or have trouble breathing, lightheadedness, fast heart rate  Sudden Onset of Symptoms    Call 911 if you suddenly have:  Leg weakness and spasm  Loss of bladder or bowel function  Seizure  Confusion, severe headache, dizziness or feeling unsteady, problems talking, difficulty swallowing, and/or numbness or muscle weakness as these could be signs of a stroke.   Follow up Appointment Your follow up appointment should be scheduled 2-3 weeks after your surgery date.  If you have not previously scheduled for a follow-up visit you can be scheduled by contacting [phone].

## 2023-06-23 NOTE — Discharge Summary (Addendum)
Physician Discharge Summary  Patient ID: Isaac Vasquez MRN: [id number] DOB/AGE: 21-Aug-1971 52 y.o.  Admit date: 06/22/2023 Discharge date: 06/23/2023  Admission Diagnoses: Cellulitis   Discharge Diagnoses:  Principal Problem:   Cellulitis   Discharged Condition: stable  Hospital Course: 52 y/o M s/p umbilical hernia repair and debridement of his abdominal wall. He was discharged with a prescription for antibiotics, however, he never filled the prescription.  He presented with persistent erythema around his incision.  His WBC was normal and CT showed expected post-operative findings w/o evidence of a recurrent hernia.  He was admitted overnight for pain control and IV antibiotics.  He discharged on HD 2 in stable condition.  He was tolerating PO, the erythema was improving, and he was ambulating at baseline.  He was instructed to complete the course of antibiotics as prescribed.    Consults: None  Significant Diagnostic Studies: CBC WNL, CT w/ typical post-operative findings and overlying cellulitis (stable from pre-op)  Treatments: antibiotics: Ancef  Discharge Exam: Blood pressure 128/82, pulse (!) 59, temperature 98.6 F (37 C), temperature source Oral, resp. rate 17, height 6\' 2"  (1.88 m), weight 100.2 kg, SpO2 98%. Gen: male, NAD Resp: no increased WOB CV: RRR Abd: soft, non-distended, incision with dermabond intact, no drainage, minimal surrounding erythema Neuro: moving all extremities  Disposition: Discharge disposition: 01-Home or Self Care        Allergies as of 06/23/2023   No Known Allergies      Medication List     TAKE these medications    acetaminophen 500 MG tablet Commonly known as: TYLENOL Take 1,000 mg by mouth as needed for mild pain or moderate pain.   cetirizine 10 MG tablet Commonly known as: ZYRTEC Take 10 mg by mouth daily.   HYDROcodone-acetaminophen 5-325 MG tablet Commonly known as: NORCO/VICODIN Take 1 tablet by mouth every 6  (six) hours as needed for up to 5 days.   ibuprofen 200 MG tablet Commonly known as: ADVIL Take 400 mg by mouth as needed for mild pain or moderate pain.   multivitamin with minerals Tabs tablet Take 1 tablet by mouth daily.         Signed: Moise Boring 06/23/2023, 9:11 AM

## 2023-06-23 NOTE — Progress Notes (Signed)
Reviewed written d/c instructions w pt and his family and all questions answered. They all verbalized understanding, including that he must pick up and start his abxs. D/C via w/c w all belongings in stable condition

## 2023-06-23 NOTE — TOC Initial Note (Signed)
Transition of Care [facility]) - Initial/Assessment Note    Patient Details  Name: Isaac Vasquez MRN: [id number] Date of Birth: 01/25/1971  [facility] of [facility]) CM/SW Contact:    Adrian Prows, RN Phone Number: 06/23/2023, 10:24 AM  Clinical Narrative:                 [facility] consult for d/c planning; no insurance listed; spoke w/ pt in room; pt says he is from home and plans to return at d/c; pt verified PCP; pt says he has [employer] [place] [employer] insurance; he verified POC father Keshone Yetter [phone]); pt denies SDOH risks; he has transportation; pt says he wears glasses; pt says he does not have DME, HH services, or home oxygen; no TOC needs.  Expected Discharge Plan: Home/Self Care Barriers to Discharge: No Barriers Identified   Patient Goals and CMS Choice Patient states their goals for this hospitalization and ongoing recovery are:: home          Expected Discharge Plan and Services   Discharge Planning Services: CM Consult   Living arrangements for the past 2 months: Single Family Home Expected Discharge Date: 06/23/23               DME Arranged: N/A DME Agency: NA       HH Arranged: NA HH Agency: NA        Prior Living Arrangements/Services Living arrangements for the past 2 months: Single Family Home Lives with:: Self Patient language and need for interpreter reviewed:: Yes Do you feel safe going back to the place where you live?: Yes      Need for Family Participation in Patient Care: Yes (Comment) Care giver support system in place?: Yes (comment) Current home services:  (n/a) Criminal Activity/Legal Involvement Pertinent to Current Situation/Hospitalization: No - Comment as needed  Activities of Daily Living Home Assistive Devices/Equipment: None ADL Screening (condition at time of admission) Patient's cognitive ability adequate to safely complete daily activities?: Yes Is the patient deaf or have difficulty hearing?: No Does the patient have  difficulty seeing, even when wearing glasses/contacts?: No Does the patient have difficulty concentrating, remembering, or making decisions?: No Patient able to express need for assistance with ADLs?: Yes Does the patient have difficulty dressing or bathing?: No Independently performs ADLs?: Yes (appropriate for developmental age) Does the patient have difficulty walking or climbing stairs?: No Weakness of Legs: None Weakness of Arms/Hands: None  Permission Sought/Granted Permission sought to share information with : Case Manager Permission granted to share information with : Yes, Verbal Permission Granted  Share Information with NAME: Case Manager     Permission granted to share info w Relationship: Isaac Vasquez (father) [phone]     Emotional Assessment Appearance:: Appears stated age Attitude/Demeanor/Rapport: Gracious Affect (typically observed): Accepting Orientation: : Oriented to Self, Oriented to Place, Oriented to  Time, Oriented to Situation Alcohol / Substance Use: Not Applicable Psych Involvement: No (comment)  Admission diagnosis:  Cellulitis [L03.90] Patient Active Problem List   Diagnosis Date Noted   Cellulitis 06/22/2023   Umbilical hernia 06/20/2023   Dyslipidemia (high LDL; low HDL) 04/22/2018   PCP:  [place], Betty G, MD Pharmacy:   [id number] [place], [place] - [address] AT [facility] OF [facility] & [address] [place] [address] Phone: [phone] Fax: [phone]     Social Determinants of Health (SDOH) Social History: SDOH Screenings   Food Insecurity: No Food Insecurity (  06/23/2023)  Housing: Low Risk  (06/23/2023)  Transportation Needs: No Transportation Needs (06/22/2023)  Utilities: Not At Risk (06/23/2023)  Depression (PHQ2-9): Medium Risk (02/09/2021)  Tobacco Use: Low Risk  (06/22/2023)   SDOH Interventions: Food Insecurity Interventions: Intervention Not Indicated, Inpatient TOC Housing  Interventions: Intervention Not Indicated, Inpatient TOC Utilities Interventions: Intervention Not Indicated, Inpatient TOC   Readmission Risk Interventions     No data to display

## 2023-06-23 NOTE — Plan of Care (Signed)
  Problem: Education: Goal: Knowledge of General Education information will improve Description: Including pain rating scale, medication(s)/side effects and non-pharmacologic comfort measures Outcome: Progressing   Problem: Pain Managment: Goal: General experience of comfort will improve Outcome: Progressing   Problem: Skin Integrity: Goal: Risk for impaired skin integrity will decrease Outcome: Progressing   

## 2023-06-23 NOTE — Progress Notes (Addendum)
Paged Dr. Hillery Hunter no return call at this time   MD placed new orders.

## 2023-06-23 NOTE — Plan of Care (Signed)

## 2023-06-27 NOTE — Anesthesia Postprocedure Evaluation (Signed)
Anesthesia Post Note  Patient: Isaac Vasquez  Procedure(s) Performed: HERNIA REPAIR UMBILICAL ADULT     Patient location during evaluation: PACU Anesthesia Type: General Level of consciousness: awake and alert Pain management: pain level controlled Vital Signs Assessment: post-procedure vital signs reviewed and stable Respiratory status: spontaneous breathing, nonlabored ventilation, respiratory function stable and patient connected to nasal cannula oxygen Cardiovascular status: blood pressure returned to baseline and stable Postop Assessment: no apparent nausea or vomiting Anesthetic complications: no   No notable events documented.  Last Vitals:  Vitals:   06/21/23 0011 06/21/23 0605  BP: 134/73 (!) 146/65  Pulse: 75 68  Resp: 18 18  Temp: 36.4 C 36.7 C  SpO2: 97% 99%    Last Pain:  Vitals:   06/21/23 0850  TempSrc:   PainSc: 2                  Rylen Swindler

## 2023-08-09 ENCOUNTER — Encounter (HOSPITAL_COMMUNITY): Payer: Self-pay | Admitting: Emergency Medicine

## 2023-08-09 ENCOUNTER — Emergency Department (HOSPITAL_COMMUNITY)
Admission: EM | Admit: 2023-08-09 | Discharge: 2023-08-09 | Disposition: A | Discharge status: Home / Self Care | Payer: 59 | Attending: Emergency Medicine | Admitting: Emergency Medicine

## 2023-08-09 ENCOUNTER — Emergency Department (HOSPITAL_COMMUNITY): Payer: 59

## 2023-08-09 DIAGNOSIS — Y92410 Unspecified street and highway as the place of occurrence of the external cause: Secondary | ICD-10-CM | POA: Diagnosis not present

## 2023-08-09 DIAGNOSIS — M545 Low back pain, unspecified: Secondary | ICD-10-CM | POA: Diagnosis not present

## 2023-08-09 DIAGNOSIS — M542 Cervicalgia: Secondary | ICD-10-CM | POA: Insufficient documentation

## 2023-08-09 MED ORDER — ACETAMINOPHEN 500 MG PO TABS
1000.0000 mg | ORAL_TABLET | Freq: Once | ORAL | Status: DC
  Filled 2023-08-09: qty 2

## 2023-08-09 MED ORDER — CELECOXIB 200 MG PO CAPS: 200.0000 mg | ORAL_CAPSULE | Freq: Two times a day (BID) | ORAL | 0 refills | Status: AC
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
## 2023-08-09 NOTE — ED Notes (Signed)
Pt is unable to be located in their assigned hallway bed.
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
## 2023-09-01 ENCOUNTER — Ambulatory Visit
Admission: EM | Admit: 2023-09-01 | Discharge: 2023-09-01 | Disposition: A | Discharge status: Home / Self Care | Payer: 59 | Attending: Internal Medicine | Admitting: Internal Medicine

## 2023-09-01 ENCOUNTER — Other Ambulatory Visit: Payer: Self-pay

## 2023-09-01 ENCOUNTER — Encounter: Payer: Self-pay | Admitting: *Deleted

## 2023-09-01 DIAGNOSIS — Z1152 Encounter for screening for COVID-19: Secondary | ICD-10-CM | POA: Diagnosis not present

## 2023-09-01 DIAGNOSIS — J029 Acute pharyngitis, unspecified: Secondary | ICD-10-CM | POA: Diagnosis not present

## 2023-09-01 DIAGNOSIS — Z20822 Contact with and (suspected) exposure to covid-19: Secondary | ICD-10-CM | POA: Diagnosis present

## 2023-09-01 LAB — POCT RAPID STREP A (OFFICE): Rapid Strep A Screen: NEGATIVE

## 2023-09-01 MED ORDER — AMOXICILLIN 500 MG PO CAPS: 500.0000 mg | ORAL_CAPSULE | Freq: Two times a day (BID) | ORAL | 0 refills | Status: AC

## 2023-09-01 NOTE — ED Provider Notes (Signed)
[facility]    CSN: [id number] Arrival date & time: 09/01/23  1030      History   Chief Complaint Chief Complaint  Patient presents with   Sore Throat    HPI Isaac Vasquez is a 52 y.o. male.   Patient presents with sore throat for 2 days.  Denies any new nasal congestion, cough, fever, chest pain, shortness of breath.  Has used cough drops.    Sore Throat    Past Medical History:  Diagnosis Date   Anxiety    Appendicitis    Depression    OSA on CPAP    Vertigo     Patient Active Problem List   Diagnosis Date Noted   Cellulitis 06/22/2023   Umbilical hernia 06/20/2023   Dyslipidemia (high LDL; low HDL) 04/22/2018    Past Surgical History:  Procedure Laterality Date   APPENDECTOMY  01/15/11   UMBILICAL HERNIA REPAIR N/A 06/20/2023   Procedure: HERNIA REPAIR UMBILICAL ADULT;  Surgeon: Moise Boring, MD;  Location: [facility];  Service: General;  Laterality: N/A;       Home Medications    Prior to Admission medications   Medication Sig Start Date End Date Taking? Authorizing Provider  amoxicillin (AMOXIL) 500 MG capsule Take 1 capsule (500 mg total) by mouth 2 (two) times daily for 10 days. 09/01/23 09/11/23 Yes Aemon Koeller, Acie Fredrickson, FNP  fexofenadine (ALLEGRA) 180 MG tablet Take by mouth.   Yes [provider]  Multiple Vitamin (MULTIVITAMIN WITH MINERALS) TABS tablet Take 1 tablet by mouth daily.   Yes [provider]  acetaminophen (TYLENOL) 500 MG tablet Take 1,000 mg by mouth as needed for mild pain or moderate pain.    [provider]  celecoxib (CELEBREX) 200 MG capsule Take 1 capsule (200 mg total) by mouth 2 (two) times daily. 08/09/23   Glyn Ade, MD  cetirizine (ZYRTEC) 10 MG tablet Take 10 mg by mouth daily.    [provider]  ibuprofen (ADVIL) 200 MG tablet Take 400 mg by mouth as needed for mild pain or moderate pain.    [provider]    Family History Family History   Problem Relation Age of Onset   Breast cancer Mother    Cancer Mother        breast   Prostate cancer Father    Cancer Father 37       prostate   Diabetes Neg Hx     Social History Social History   Tobacco Use   Smoking status: Never   Smokeless tobacco: Never  Vaping Use   Vaping status: Never Used  Substance Use Topics   Alcohol use: Yes    Comment: OCCASSIONAL   Drug use: No     Allergies   Patient has no known allergies.   Review of Systems Review of Systems Per HPI  Physical Exam Triage Vital Signs ED Triage Vitals  Encounter Vitals Group     BP 09/01/23 1402 (!) 146/81     Systolic BP Percentile --      Diastolic BP Percentile --      Pulse Rate 09/01/23 1402 70     Resp 09/01/23 1402 18     Temp 09/01/23 1402 98.1 F (36.7 C)     Temp Source 09/01/23 1402 Oral     SpO2 09/01/23 1402 96 %     Weight --      Height --      Head Circumference --  Peak Flow --      Pain Score 09/01/23 1357 8     Pain Loc --      Pain Education --      Exclude from Growth Chart --    No data found.  Updated Vital Signs BP (!) 146/81 (BP Location: Right Arm)   Pulse 70   Temp 98.1 F (36.7 C) (Oral)   Resp 18   SpO2 96%   Visual Acuity Right Eye Distance:   Left Eye Distance:   Bilateral Distance:    Right Eye Near:   Left Eye Near:    Bilateral Near:     Physical Exam Constitutional:      General: He is not in acute distress.    Appearance: Normal appearance. He is not toxic-appearing or diaphoretic.  HENT:     Head: Normocephalic and atraumatic.     Right Ear: Tympanic membrane and ear canal normal.     Left Ear: Tympanic membrane and ear canal normal.     Nose: No congestion.     Mouth/Throat:     Mouth: Mucous membranes are moist.     Pharynx: Posterior oropharyngeal erythema present.  Eyes:     Extraocular Movements: Extraocular movements intact.     Conjunctiva/sclera: Conjunctivae normal.     Pupils: Pupils are equal, round, and  reactive to light.  Cardiovascular:     Rate and Rhythm: Normal rate and regular rhythm.     Pulses: Normal pulses.     Heart sounds: Normal heart sounds.  Pulmonary:     Effort: Pulmonary effort is normal. No respiratory distress.     Breath sounds: Normal breath sounds. No stridor. No wheezing, rhonchi or rales.  Abdominal:     General: Abdomen is flat. Bowel sounds are normal.     Palpations: Abdomen is soft.  Musculoskeletal:        General: Normal range of motion.     Cervical back: Normal range of motion.  Skin:    General: Skin is warm and dry.  Neurological:     General: No focal deficit present.     Mental Status: He is alert and oriented to person, place, and time. Mental status is at baseline.  Psychiatric:        Mood and Affect: Mood normal.        Behavior: Behavior normal.      UC Treatments / Results  Labs (all labs ordered are listed, but only abnormal results are displayed) Labs Reviewed  CULTURE, GROUP A STREP ([facility])  SARS CORONAVIRUS 2 (TAT 6-24 HRS)  POCT RAPID STREP A (OFFICE)    EKG   Radiology No results found.  Procedures Procedures (including critical care time)  Medications Ordered in UC Medications - No data to display  Initial Impression / Assessment and Plan / UC Course  I have reviewed the triage vital signs and the nursing notes.  Pertinent labs & imaging results that were available during my care of the patient were reviewed by me and considered in my medical decision making (see chart for details).     Rapid strep is negative but given appearance of posterior pharynx on exam, will opt to treat with antibiotic therapy today.  No signs of peritonsillar abscess on exam.  Throat culture and COVID test pending.  Advised supportive care and symptom management.  Advised strict return precautions.  Patient verbalized understanding and was agreeable with plan. Final Clinical Impressions(s) / UC Diagnoses   Final diagnoses:  Acute  pharyngitis, unspecified etiology  Encounter for laboratory testing for COVID-19 virus     Discharge Instructions      I have prescribed you an antibiotic to take for symptoms.  Throat culture and COVID test pending.  Will call if they are positive.    ED Prescriptions     Medication Sig Dispense Auth. Provider   amoxicillin (AMOXIL) 500 MG capsule Take 1 capsule (500 mg total) by mouth 2 (two) times daily for 10 days. 20 capsule Gustavus Bryant, [place]      PDMP not reviewed this encounter.   Gustavus Bryant, [place] 09/01/23 1501

## 2023-09-01 NOTE — ED Triage Notes (Signed)
Sore throat x 2 days. Painful to swallow. Using OTC meds with some relief

## 2023-09-01 NOTE — Discharge Instructions (Signed)
I have prescribed you an antibiotic to take for symptoms.  Throat culture and COVID test pending.  Will call if they are positive.

## 2023-09-02 LAB — SARS CORONAVIRUS 2 (TAT 6-24 HRS): SARS Coronavirus 2: NEGATIVE

## 2023-09-03 LAB — CULTURE, GROUP A STREP (THRC)

## 2024-05-01 ENCOUNTER — Encounter: Payer: Self-pay | Admitting: Advanced Practice Midwife
